# Patient Record
Sex: Female | Born: 1965
Health system: Southern US, Community
[De-identification: ages and names within clinical notes are randomized; demographics above are authoritative.]

## PROBLEM LIST (undated history)

## (undated) DIAGNOSIS — G4730 Sleep apnea, unspecified: Secondary | ICD-10-CM

## (undated) DIAGNOSIS — E039 Hypothyroidism, unspecified: Secondary | ICD-10-CM

## (undated) DIAGNOSIS — I10 Essential (primary) hypertension: Secondary | ICD-10-CM

## (undated) DIAGNOSIS — F419 Anxiety disorder, unspecified: Secondary | ICD-10-CM

## (undated) DIAGNOSIS — K5090 Crohn's disease, unspecified, without complications: Secondary | ICD-10-CM

## (undated) DIAGNOSIS — F32A Depression, unspecified: Secondary | ICD-10-CM

## (undated) DIAGNOSIS — M1990 Unspecified osteoarthritis, unspecified site: Secondary | ICD-10-CM

## (undated) HISTORY — DX: Hypothyroidism, unspecified: E03.9

## (undated) HISTORY — PX: BREAST SURGERY: SHX581

## (undated) HISTORY — PX: THYROIDECTOMY, PARTIAL: SHX18

## (undated) HISTORY — PX: CARPAL TUNNEL RELEASE: SHX101

## (undated) HISTORY — PX: OTHER SURGICAL HISTORY: SHX169

## (undated) HISTORY — PX: WISDOM TOOTH EXTRACTION: SHX21

## (undated) HISTORY — PX: KNEE ARTHROSCOPY: SHX127

## (undated) SURGERY — Surgical Case
Anesthesia: *Unknown

---

## 2001-02-17 HISTORY — PX: OOPHORECTOMY: SHX86

## 2006-02-17 HISTORY — PX: BREAST LUMPECTOMY: SHX2

## 2014-02-17 HISTORY — PX: BOWEL RESECTION: SHX1257

## 2018-11-29 DIAGNOSIS — Z23 Encounter for immunization: Secondary | ICD-10-CM | POA: Diagnosis not present

## 2018-11-29 DIAGNOSIS — K5090 Crohn's disease, unspecified, without complications: Secondary | ICD-10-CM | POA: Diagnosis not present

## 2018-11-29 DIAGNOSIS — K5080 Crohn's disease of both small and large intestine without complications: Secondary | ICD-10-CM | POA: Diagnosis not present

## 2018-11-30 DIAGNOSIS — K5080 Crohn's disease of both small and large intestine without complications: Secondary | ICD-10-CM | POA: Diagnosis not present

## 2018-12-03 ENCOUNTER — Other Ambulatory Visit: Payer: Self-pay | Admitting: Gastroenterology

## 2018-12-03 DIAGNOSIS — R9389 Abnormal findings on diagnostic imaging of other specified body structures: Secondary | ICD-10-CM

## 2018-12-07 DIAGNOSIS — K5090 Crohn's disease, unspecified, without complications: Secondary | ICD-10-CM | POA: Diagnosis not present

## 2018-12-07 DIAGNOSIS — Z0000 Encounter for general adult medical examination without abnormal findings: Secondary | ICD-10-CM | POA: Diagnosis not present

## 2018-12-07 DIAGNOSIS — I10 Essential (primary) hypertension: Secondary | ICD-10-CM | POA: Diagnosis not present

## 2018-12-08 DIAGNOSIS — I10 Essential (primary) hypertension: Secondary | ICD-10-CM | POA: Diagnosis not present

## 2018-12-08 DIAGNOSIS — Z0000 Encounter for general adult medical examination without abnormal findings: Secondary | ICD-10-CM | POA: Diagnosis not present

## 2018-12-08 DIAGNOSIS — Z01419 Encounter for gynecological examination (general) (routine) without abnormal findings: Secondary | ICD-10-CM | POA: Diagnosis not present

## 2018-12-08 DIAGNOSIS — K5090 Crohn's disease, unspecified, without complications: Secondary | ICD-10-CM | POA: Diagnosis not present

## 2018-12-08 DIAGNOSIS — N6332 Unspecified lump in axillary tail of the left breast: Secondary | ICD-10-CM | POA: Diagnosis not present

## 2018-12-09 ENCOUNTER — Other Ambulatory Visit: Payer: Self-pay

## 2018-12-09 ENCOUNTER — Ambulatory Visit
Admission: RE | Admit: 2018-12-09 | Discharge: 2018-12-09 | Disposition: A | Discharge status: Home / Self Care | Payer: PPO | Source: Ambulatory Visit | Attending: Gastroenterology | Admitting: Gastroenterology

## 2018-12-09 DIAGNOSIS — R9389 Abnormal findings on diagnostic imaging of other specified body structures: Secondary | ICD-10-CM

## 2018-12-09 DIAGNOSIS — R918 Other nonspecific abnormal finding of lung field: Secondary | ICD-10-CM | POA: Diagnosis not present

## 2018-12-21 DIAGNOSIS — R2232 Localized swelling, mass and lump, left upper limb: Secondary | ICD-10-CM | POA: Diagnosis not present

## 2018-12-21 DIAGNOSIS — R2231 Localized swelling, mass and lump, right upper limb: Secondary | ICD-10-CM | POA: Diagnosis not present

## 2018-12-21 DIAGNOSIS — Z803 Family history of malignant neoplasm of breast: Secondary | ICD-10-CM | POA: Diagnosis not present

## 2019-01-06 ENCOUNTER — Other Ambulatory Visit: Payer: Self-pay | Admitting: Gastroenterology

## 2019-01-06 ENCOUNTER — Ambulatory Visit
Admission: RE | Admit: 2019-01-06 | Discharge: 2019-01-06 | Disposition: A | Discharge status: Home / Self Care | Payer: PPO | Source: Ambulatory Visit | Attending: Gastroenterology | Admitting: Gastroenterology

## 2019-01-06 DIAGNOSIS — R140 Abdominal distension (gaseous): Secondary | ICD-10-CM

## 2019-01-06 DIAGNOSIS — K5901 Slow transit constipation: Secondary | ICD-10-CM

## 2019-01-06 DIAGNOSIS — R195 Other fecal abnormalities: Secondary | ICD-10-CM | POA: Diagnosis not present

## 2019-01-06 DIAGNOSIS — K5989 Other specified functional intestinal disorders: Secondary | ICD-10-CM | POA: Diagnosis not present

## 2019-01-07 ENCOUNTER — Ambulatory Visit
Admission: RE | Admit: 2019-01-07 | Discharge: 2019-01-07 | Disposition: A | Discharge status: Home / Self Care | Payer: PPO | Source: Ambulatory Visit | Attending: Gastroenterology | Admitting: Gastroenterology

## 2019-01-07 ENCOUNTER — Other Ambulatory Visit: Payer: Self-pay | Admitting: Gastroenterology

## 2019-01-07 DIAGNOSIS — D3001 Benign neoplasm of right kidney: Secondary | ICD-10-CM | POA: Diagnosis not present

## 2019-01-07 DIAGNOSIS — R9389 Abnormal findings on diagnostic imaging of other specified body structures: Secondary | ICD-10-CM

## 2019-01-07 DIAGNOSIS — R195 Other fecal abnormalities: Secondary | ICD-10-CM | POA: Diagnosis not present

## 2019-01-07 DIAGNOSIS — Q453 Other congenital malformations of pancreas and pancreatic duct: Secondary | ICD-10-CM | POA: Diagnosis not present

## 2019-01-07 DIAGNOSIS — K8020 Calculus of gallbladder without cholecystitis without obstruction: Secondary | ICD-10-CM | POA: Diagnosis not present

## 2019-01-07 MED ORDER — IOPAMIDOL (ISOVUE-300) INJECTION 61%
100.0000 mL | Freq: Once | INTRAVENOUS | Status: AC | PRN
  Administered 2019-01-07: 100 mL via INTRAVENOUS

## 2019-01-27 DIAGNOSIS — N6332 Unspecified lump in axillary tail of the left breast: Secondary | ICD-10-CM | POA: Diagnosis not present

## 2019-01-27 DIAGNOSIS — I10 Essential (primary) hypertension: Secondary | ICD-10-CM | POA: Diagnosis not present

## 2019-01-27 DIAGNOSIS — K5090 Crohn's disease, unspecified, without complications: Secondary | ICD-10-CM | POA: Diagnosis not present

## 2019-01-27 DIAGNOSIS — Z0000 Encounter for general adult medical examination without abnormal findings: Secondary | ICD-10-CM | POA: Diagnosis not present

## 2019-02-21 DIAGNOSIS — K5901 Slow transit constipation: Secondary | ICD-10-CM | POA: Diagnosis not present

## 2019-02-21 DIAGNOSIS — N6332 Unspecified lump in axillary tail of the left breast: Secondary | ICD-10-CM | POA: Diagnosis not present

## 2019-02-21 DIAGNOSIS — K5090 Crohn's disease, unspecified, without complications: Secondary | ICD-10-CM | POA: Diagnosis not present

## 2019-02-21 DIAGNOSIS — K2100 Gastro-esophageal reflux disease with esophagitis, without bleeding: Secondary | ICD-10-CM | POA: Diagnosis not present

## 2019-02-21 DIAGNOSIS — K5080 Crohn's disease of both small and large intestine without complications: Secondary | ICD-10-CM | POA: Diagnosis not present

## 2019-02-21 DIAGNOSIS — Z0000 Encounter for general adult medical examination without abnormal findings: Secondary | ICD-10-CM | POA: Diagnosis not present

## 2019-02-21 DIAGNOSIS — I10 Essential (primary) hypertension: Secondary | ICD-10-CM | POA: Diagnosis not present

## 2019-04-12 DIAGNOSIS — Z20828 Contact with and (suspected) exposure to other viral communicable diseases: Secondary | ICD-10-CM | POA: Diagnosis not present

## 2019-08-04 DIAGNOSIS — H16223 Keratoconjunctivitis sicca, not specified as Sjogren's, bilateral: Secondary | ICD-10-CM | POA: Diagnosis not present

## 2019-08-08 DIAGNOSIS — A184 Tuberculosis of skin and subcutaneous tissue: Secondary | ICD-10-CM | POA: Diagnosis not present

## 2019-08-08 DIAGNOSIS — Z111 Encounter for screening for respiratory tuberculosis: Secondary | ICD-10-CM | POA: Diagnosis not present

## 2019-09-13 DIAGNOSIS — I10 Essential (primary) hypertension: Secondary | ICD-10-CM | POA: Diagnosis not present

## 2019-09-13 DIAGNOSIS — E785 Hyperlipidemia, unspecified: Secondary | ICD-10-CM | POA: Diagnosis not present

## 2019-09-13 DIAGNOSIS — K5090 Crohn's disease, unspecified, without complications: Secondary | ICD-10-CM | POA: Diagnosis not present

## 2019-09-13 DIAGNOSIS — Z1152 Encounter for screening for COVID-19: Secondary | ICD-10-CM | POA: Diagnosis not present

## 2019-09-13 DIAGNOSIS — J322 Chronic ethmoidal sinusitis: Secondary | ICD-10-CM | POA: Diagnosis not present

## 2019-09-13 DIAGNOSIS — N62 Hypertrophy of breast: Secondary | ICD-10-CM | POA: Diagnosis not present

## 2019-09-13 DIAGNOSIS — E039 Hypothyroidism, unspecified: Secondary | ICD-10-CM | POA: Diagnosis not present

## 2019-09-15 ENCOUNTER — Other Ambulatory Visit: Payer: Self-pay | Admitting: Physician Assistant

## 2019-09-15 ENCOUNTER — Other Ambulatory Visit: Payer: Self-pay | Admitting: *Deleted

## 2019-09-15 DIAGNOSIS — K5090 Crohn's disease, unspecified, without complications: Secondary | ICD-10-CM | POA: Diagnosis not present

## 2019-09-15 DIAGNOSIS — R110 Nausea: Secondary | ICD-10-CM | POA: Diagnosis not present

## 2019-09-15 DIAGNOSIS — K5080 Crohn's disease of both small and large intestine without complications: Secondary | ICD-10-CM | POA: Diagnosis not present

## 2019-09-15 DIAGNOSIS — K50919 Crohn's disease, unspecified, with unspecified complications: Secondary | ICD-10-CM

## 2019-09-15 DIAGNOSIS — K5900 Constipation, unspecified: Secondary | ICD-10-CM | POA: Diagnosis not present

## 2019-09-15 DIAGNOSIS — R1310 Dysphagia, unspecified: Secondary | ICD-10-CM | POA: Diagnosis not present

## 2019-09-29 ENCOUNTER — Ambulatory Visit
Admission: RE | Admit: 2019-09-29 | Discharge: 2019-09-29 | Disposition: A | Discharge status: Home / Self Care | Payer: PPO | Source: Ambulatory Visit | Attending: Physician Assistant | Admitting: Physician Assistant

## 2019-09-29 ENCOUNTER — Other Ambulatory Visit: Payer: Self-pay

## 2019-09-29 DIAGNOSIS — D1771 Benign lipomatous neoplasm of kidney: Secondary | ICD-10-CM | POA: Diagnosis not present

## 2019-09-29 DIAGNOSIS — I700 Atherosclerosis of aorta: Secondary | ICD-10-CM | POA: Diagnosis not present

## 2019-09-29 DIAGNOSIS — K8689 Other specified diseases of pancreas: Secondary | ICD-10-CM | POA: Diagnosis not present

## 2019-09-29 DIAGNOSIS — R110 Nausea: Secondary | ICD-10-CM

## 2019-09-29 DIAGNOSIS — K8020 Calculus of gallbladder without cholecystitis without obstruction: Secondary | ICD-10-CM | POA: Diagnosis not present

## 2019-09-29 DIAGNOSIS — K50919 Crohn's disease, unspecified, with unspecified complications: Secondary | ICD-10-CM

## 2019-09-29 MED ORDER — IOPAMIDOL (ISOVUE-300) INJECTION 61%
100.0000 mL | Freq: Once | INTRAVENOUS | Status: AC | PRN
  Administered 2019-09-29: 100 mL via INTRAVENOUS

## 2019-10-06 DIAGNOSIS — Z1152 Encounter for screening for COVID-19: Secondary | ICD-10-CM | POA: Diagnosis not present

## 2019-10-06 DIAGNOSIS — K5090 Crohn's disease, unspecified, without complications: Secondary | ICD-10-CM | POA: Diagnosis not present

## 2019-10-06 DIAGNOSIS — J322 Chronic ethmoidal sinusitis: Secondary | ICD-10-CM | POA: Diagnosis not present

## 2019-10-06 DIAGNOSIS — N62 Hypertrophy of breast: Secondary | ICD-10-CM | POA: Diagnosis not present

## 2019-10-06 DIAGNOSIS — I10 Essential (primary) hypertension: Secondary | ICD-10-CM | POA: Diagnosis not present

## 2019-10-06 DIAGNOSIS — J029 Acute pharyngitis, unspecified: Secondary | ICD-10-CM | POA: Diagnosis not present

## 2019-10-25 DIAGNOSIS — G8929 Other chronic pain: Secondary | ICD-10-CM | POA: Diagnosis not present

## 2019-10-25 DIAGNOSIS — M549 Dorsalgia, unspecified: Secondary | ICD-10-CM | POA: Diagnosis not present

## 2019-10-25 DIAGNOSIS — L304 Erythema intertrigo: Secondary | ICD-10-CM | POA: Diagnosis not present

## 2019-10-25 DIAGNOSIS — M542 Cervicalgia: Secondary | ICD-10-CM | POA: Diagnosis not present

## 2019-10-25 DIAGNOSIS — N62 Hypertrophy of breast: Secondary | ICD-10-CM | POA: Diagnosis not present

## 2019-10-27 ENCOUNTER — Other Ambulatory Visit: Payer: Self-pay | Admitting: Gastroenterology

## 2019-10-27 ENCOUNTER — Ambulatory Visit
Admission: RE | Admit: 2019-10-27 | Discharge: 2019-10-27 | Disposition: A | Discharge status: Home / Self Care | Payer: PPO | Source: Ambulatory Visit | Attending: Gastroenterology | Admitting: Gastroenterology

## 2019-10-27 DIAGNOSIS — K5900 Constipation, unspecified: Secondary | ICD-10-CM

## 2019-10-27 DIAGNOSIS — R1012 Left upper quadrant pain: Secondary | ICD-10-CM | POA: Diagnosis not present

## 2019-10-27 DIAGNOSIS — R112 Nausea with vomiting, unspecified: Secondary | ICD-10-CM | POA: Diagnosis not present

## 2019-11-03 ENCOUNTER — Ambulatory Visit: Payer: PPO | Admitting: Neurology

## 2019-11-03 ENCOUNTER — Encounter: Payer: Self-pay | Admitting: Neurology

## 2019-11-03 VITALS — BP 132/80 | HR 66 | Ht 65.0 in | Wt 166.0 lb

## 2019-11-03 DIAGNOSIS — R635 Abnormal weight gain: Secondary | ICD-10-CM

## 2019-11-03 DIAGNOSIS — G4733 Obstructive sleep apnea (adult) (pediatric): Secondary | ICD-10-CM

## 2019-11-03 DIAGNOSIS — E663 Overweight: Secondary | ICD-10-CM

## 2019-11-03 DIAGNOSIS — R351 Nocturia: Secondary | ICD-10-CM

## 2019-11-03 NOTE — Progress Notes (Signed)
Subjective:    Patient ID: Rhonda Wilkerson is a 54 y.o. female.  HPI     Rhonda Age, MD, PhD [facility] [address] [place], [place] [address]  Dear Dr. Darron Doom,  I saw your patient, Rhonda Wilkerson, upon your kind request, In my sleep clinic today for initial consultation of her sleep disorder, in particular, concern for underlying obstructive sleep apnea.  The patient is accompanied by her wife, Rhonda Wilkerson, today.  As you know, Rhonda Wilkerson is a 54 year old right-handed woman with an underlying medical history of hypertension, hyperlipidemia, hypothyroidism, Crohn's disease, scoliosis, tendinitis, allergic rhinitis, and overweight state, who was diagnosed with obstructive sleep apnea and placed on CPAP therapy.  I reviewed your office note from 10/06/2019.  She had sleep study testing in [place] in 2019 and I was able to review her sleep study from 04/16/2017. Overall AHI was 5.26/h O2 nadir was 81%.  She has been on CPAP therapy.  Pressure at 6 cm, a compliance report from 05/23/2017 through 06/21/2017 showed 100% compliance with an average usage of 7 hours and 41 minutes.  Residual AHI 3.1/h, no significant leak.  I was also able to review her most recent compliance data for the past 30 days during which time she was 100% compliant, average usage of 7 hours and 53 minutes, residual AHI 2/h, no significant leak, pressure at 6 cm.  She reports that she did not have any telltale improvement in her sleep quality after starting her CPAP but her wife reports that she had significant improvement in her sleep consolidation and no longer snores.  The patient has a history of significant nocturia, an average of 3 times per night which is about the same.  She denies any recurrent morning headaches.  Unfortunately, her CPAP machine has been recalled, it is a [employer]ironics dream station.  Her DME company is in [place] and the patient has not registered her machine on  the website yet.  She reports that sometimes even the 6 cm of pressure seems too high.  She is willing to continue to use the machine, she has not had any high heat exposure to the machine or used a ozone based cleaner.  She is interested in being reevaluated for her sleep apnea.  She has slowly gained some weight in the past few years.  She has a family history of sleep apnea, both her brother and her mother have CPAP machines.  She moved with her wife from [place] to [place] little over a year ago.  They have a dog in the household, the patient is on disability.  She has smoked in the past, quit smoking in 2011 and drinks caffeine in the form of coffee, typically 2 cups in the mornings, alcohol socially.  She goes to bed around 1030 or 11 PM and rise time is around 8. Her Past Medical History Is Significant For: Past Medical History:  Diagnosis Date   Hypothyroid     Her Past Surgical History Is Significant For: Past Surgical History:  Procedure Laterality Date   BOWEL RESECTION     BREAST SURGERY     CARPAL TUNNEL RELEASE     [place]      Her Family History Is Significant For: History reviewed. No pertinent family history.  Her Social History Is Significant For: Social History   Socioeconomic History   Marital status: Single    Spouse name: Not on file   Number of children: Not on file  Years of education: Not on file   Highest education level: Not on file  Occupational History   Not on file  Tobacco Use   Smoking status: Never Smoker   Smokeless tobacco: Never Used  Substance and Sexual Activity   Alcohol use: Yes    Comment: Socially    Drug use: Never   Sexual activity: Not on file  Other Topics Concern   Not on file  Social History Narrative   Not on file   Social Determinants of Health   Financial Resource Strain:    Difficulty of Paying Living Expenses: Not on file  Food Insecurity:    Worried About [place] in the Last  Year: Not on file   Ran Out of Food in the Last Year: Not on file  Transportation Needs:    Lack of Transportation (Medical): Not on file   Lack of Transportation (Non-Medical): Not on file  Physical Activity:    Days of Exercise per Week: Not on file   Minutes of Exercise per Session: Not on file  Stress:    Feeling of Stress : Not on file  Social Connections:    Frequency of Communication with Friends and Family: Not on file   Frequency of Social Gatherings with Friends and Family: Not on file   Attends Religious Services: Not on file   Active Member of Clubs or Organizations: Not on file   Attends Archivist Meetings: Not on file   Marital Status: Not on file    Her Allergies Are:  No Known Allergies:   Her Current Medications Are:  Outpatient Encounter Medications as of 11/03/2019  Medication Sig   atorvastatin (LIPITOR) 10 MG tablet Take 10 mg by mouth daily.   Cholecalciferol (VITAMIN D3 PO) Take by mouth.   Cyanocobalamin (VITAMIN B-12 PO) Take by mouth.   FOLIC ACID PO Take by mouth.   levothyroxine (SYNTHROID) 25 MCG tablet Take 25 mcg by mouth daily before breakfast.   linaCLOtide (LINZESS PO) Take by mouth.   losartan-hydrochlorothiazide (HYZAAR) 100-25 MG tablet Take 1 tablet by mouth daily.   Multiple Vitamin (MULTIVITAMIN WITH MINERALS) TABS tablet Take 1 tablet by mouth daily.   POTASSIUM PO Take by mouth.   ustekinumab (STELARA) 90 MG/ML SOSY injection    valACYclovir (VALTREX) 1000 MG tablet Take 1,000 mg by mouth.   No facility-administered encounter medications on file as of 11/03/2019.  :  Review of Systems:  Out of a complete 14 point review of systems, all are reviewed and negative with the exception of these symptoms as listed below:  Review of Systems  Neurological:       Here for sleep consult. Prior sleep study in 2019 pt was started on machine. Pt is on a phillips machine that has been recalled. Pt wanted to  discuss the possibility of getting a new machine since she recently switched to medicare.   Epworth Sleepiness Scale 0= would never doze 1= slight chance of dozing 2= moderate chance of dozing 3= high chance of dozing  Sitting and reading:0 Watching TV:0 Sitting inactive in a public place (ex. Theater or meeting):0 As a passenger in a car for an hour without a break:0 Lying down to rest in the afternoon:1 Sitting and talking to someone:0 Sitting quietly after lunch (no alcohol):0 In a car, while stopped in traffic:0 Total:1 Pt is a compliant cpap user.    Objective:  Neurological Exam  Physical Exam Physical Examination:   Vitals:  11/03/19 1540  BP: 132/80  Pulse: 66  SpO2: 99%    General Examination: The patient is a very pleasant 54 y.o. female in no acute distress. She appears well-developed and well-nourished and well groomed.   HEENT: Normocephalic, atraumatic, pupils are equal, round and reactive to light, extraocular tracking is good without limitation to gaze excursion or nystagmus noted. Hearing is grossly intact. Face is symmetric with normal facial animation. Speech is clear with no dysarthria noted. There is no hypophonia. There is no lip, neck/head, jaw or voice tremor. Neck is supple with full range of passive and active motion. There are no carotid bruits on auscultation. Oropharynx exam reveals: no significant mouth dryness, good dental hygiene and mild airway crowding, due to small airway entry, slightly wider tongue, tonsils in place, about 1+ bilaterally.  Mallampati is class II.  Tongue protrudes centrally and palate elevates symmetrically.  Neck circumference is 14 inches.  Chest: Clear to auscultation without wheezing, rhonchi or crackles noted.  Heart: S1+S2+0, regular and normal without murmurs, rubs or gallops noted.   Abdomen: Soft, non-tender and non-distended with normal bowel sounds appreciated on auscultation.  Extremities: There is no  pitting edema in the distal lower extremities bilaterally.   Skin: Warm and dry without trophic changes noted.   Musculoskeletal: exam reveals no obvious joint deformities, tenderness or joint swelling or erythema.   Neurologically:  Mental status: The patient is awake, alert and oriented in all 4 spheres. Her immediate and remote memory, attention, language skills and fund of knowledge are appropriate. There is no evidence of aphasia, agnosia, apraxia or anomia. Speech is clear with normal prosody and enunciation. Thought process is linear. Mood is normal and affect is normal.  Cranial nerves II - XII are as described above under HEENT exam.  Motor exam: Normal bulk, strength and tone is noted. There is no tremor, Romberg is negative. Fine motor skills and coordination: grossly intact.  Cerebellar testing: No dysmetria or intention tremor. There is no truncal or gait ataxia.  Sensory exam: intact to light touch in the upper and lower extremities.  Gait, station and balance: She stands easily. No veering to one side is noted. No leaning to one side is noted. Posture is Wilkerson-appropriate and stance is narrow based. Gait shows normal stride length and normal pace. No problems turning are noted. Tandem walk is unremarkable.                Assessment and Plan:   In summary, Tiffinie Caillier is a very pleasant 54 y.o.-year old female with an underlying medical history of hypertension, hyperlipidemia, hypothyroidism, Crohn's disease, scoliosis, tendinitis, allergic rhinitis, and overweight state, who was diagnosed with obstructive sleep apnea when she was in [place].  Testing was in February 2019.  She has been on CPAP of 6 cm with full compliance.  She has had some reduction in her sleep disruption and restlessness at night per wife's report as well as elimination of snoring.  She does have some difficulty tolerating the pressure even though she is on the lower pressure setting.  She has had some weight  gain in the past couple of years.  She has a [employer] which has been recalled.  She is encouraged to register read on the website and since she has had some change in her weight I would like to proceed with a reevaluation with a sleep study.  To that end, I will order a sleep study for a laboratory attended evaluation of her  sleep disordered breathing.  She is in the meantime advised that she can continue to use her machine so long as she has not had any high heat exposure to her machine or used a so clean or ozone based cleaner which she has not.  She is advised that we should be able to call her soon to schedule her sleep study.  Based on her updated sleep study I can write for a new machine for her. She is agreeable to continuing treatment for sleep apnea. I answered all her questions today and the patient and Alisa were in agreement.  Thank you very much for allowing me to participate in the care of this nice patient. If I can be of any further assistance to you please do not hesitate to call me at [phone].  Sincerely,   Rhonda Age, MD, PhD

## 2019-11-03 NOTE — Patient Instructions (Addendum)
Thank you for choosing [facility] for your sleep related care! It was nice to meet you today! I appreciate that you entrust me with your sleep related healthcare concerns. I hope, I was able to address at least some of your concerns today, and that I can help you feel reassured and also get better.    Here is what we discussed today and what we came up with as our plan for you:    Based on your symptoms and your exam I believe you may still be at risk for obstructive sleep apnea and would benefit from re-evaluation as you have gained some weight and need a new machine.   Therefore, I think we should proceed with a sleep study to determine how severe your sleep apnea is.  Please try to go online to register your current DreamStation machine as it has been recalled.  In the meantime, as long as you have not been using a so clean or ozone based treatment machine and have not had any high heat exposure to your machine, you should be able to continue to use your machine.  Please remember, the risks and ramifications of moderate to severe obstructive sleep apnea or OSA are: Cardiovascular disease, including congestive heart failure, stroke, difficult to control hypertension, arrhythmias, and even type 2 diabetes has been linked to untreated OSA. Sleep apnea causes disruption of sleep and sleep deprivation in most cases, which, in turn, can cause recurrent headaches, problems with memory, mood, concentration, focus, and vigilance. Most people with untreated sleep apnea report excessive daytime sleepiness, which can affect their ability to drive. Please do not drive if you feel sleepy.   I plan to see you back after your sleep study and we will also call you with your test results once I have had a chance to look at your sleep study.   Our sleep lab administrative assistant will call you to schedule your sleep study. If you don't hear back from her by about 2 weeks from now, please feel  free to call her at [phone]. You can leave a message with your phone number and concerns, if you get the voicemail box. She will call back as soon as possible.

## 2019-11-07 ENCOUNTER — Telehealth: Payer: Self-pay

## 2019-11-07 DIAGNOSIS — G4733 Obstructive sleep apnea (adult) (pediatric): Secondary | ICD-10-CM

## 2019-11-07 DIAGNOSIS — E663 Overweight: Secondary | ICD-10-CM

## 2019-11-07 DIAGNOSIS — R635 Abnormal weight gain: Secondary | ICD-10-CM

## 2019-11-07 NOTE — Telephone Encounter (Signed)
HST order placed in epic.

## 2019-11-07 NOTE — Telephone Encounter (Signed)
[employer] will not cover an in lab sleep study. Does not meet medical criteria. Will need HST order.

## 2019-11-09 ENCOUNTER — Telehealth: Payer: Self-pay

## 2019-11-09 NOTE — Telephone Encounter (Signed)
LVM for pt to call me back to schedule sleep study  

## 2019-11-22 DIAGNOSIS — Z1152 Encounter for screening for COVID-19: Secondary | ICD-10-CM | POA: Diagnosis not present

## 2019-11-22 DIAGNOSIS — J322 Chronic ethmoidal sinusitis: Secondary | ICD-10-CM | POA: Diagnosis not present

## 2019-11-22 DIAGNOSIS — I10 Essential (primary) hypertension: Secondary | ICD-10-CM | POA: Diagnosis not present

## 2019-11-22 DIAGNOSIS — K5090 Crohn's disease, unspecified, without complications: Secondary | ICD-10-CM | POA: Diagnosis not present

## 2019-11-23 DIAGNOSIS — Z23 Encounter for immunization: Secondary | ICD-10-CM | POA: Diagnosis not present

## 2019-11-23 DIAGNOSIS — I10 Essential (primary) hypertension: Secondary | ICD-10-CM | POA: Diagnosis not present

## 2019-11-23 DIAGNOSIS — E039 Hypothyroidism, unspecified: Secondary | ICD-10-CM | POA: Diagnosis not present

## 2019-11-23 DIAGNOSIS — Z1152 Encounter for screening for COVID-19: Secondary | ICD-10-CM | POA: Diagnosis not present

## 2019-11-23 DIAGNOSIS — F411 Generalized anxiety disorder: Secondary | ICD-10-CM | POA: Diagnosis not present

## 2019-11-23 DIAGNOSIS — K5090 Crohn's disease, unspecified, without complications: Secondary | ICD-10-CM | POA: Diagnosis not present

## 2019-11-23 DIAGNOSIS — F39 Unspecified mood [affective] disorder: Secondary | ICD-10-CM | POA: Diagnosis not present

## 2019-11-23 DIAGNOSIS — E785 Hyperlipidemia, unspecified: Secondary | ICD-10-CM | POA: Diagnosis not present

## 2019-11-23 DIAGNOSIS — Z20822 Contact with and (suspected) exposure to covid-19: Secondary | ICD-10-CM | POA: Diagnosis not present

## 2019-11-30 ENCOUNTER — Ambulatory Visit (INDEPENDENT_AMBULATORY_CARE_PROVIDER_SITE_OTHER): Payer: PPO | Admitting: Neurology

## 2019-11-30 DIAGNOSIS — G4733 Obstructive sleep apnea (adult) (pediatric): Secondary | ICD-10-CM

## 2019-11-30 DIAGNOSIS — E663 Overweight: Secondary | ICD-10-CM

## 2019-11-30 DIAGNOSIS — R635 Abnormal weight gain: Secondary | ICD-10-CM

## 2019-12-02 DIAGNOSIS — K5900 Constipation, unspecified: Secondary | ICD-10-CM | POA: Diagnosis not present

## 2019-12-02 DIAGNOSIS — K5090 Crohn's disease, unspecified, without complications: Secondary | ICD-10-CM | POA: Diagnosis not present

## 2019-12-06 DIAGNOSIS — M25561 Pain in right knee: Secondary | ICD-10-CM | POA: Diagnosis not present

## 2019-12-07 ENCOUNTER — Telehealth: Payer: Self-pay

## 2019-12-07 NOTE — Addendum Note (Signed)
Addended by: Star Age on: 12/07/2019 02:03 PM   Modules accepted: Orders

## 2019-12-07 NOTE — Telephone Encounter (Signed)
-----   Message from Star Age, MD sent at 12/07/2019  2:03 PM EDT ----- Patient referred by Dr. Darron Doom for re-eval of her OSA, she has been on CPAP of 6 cm, study done in [place]. I saw her on 11/03/19, HST on 11/30/19.  Please call and notify the patient that the recent home sleep test confirmed her diagnosis of mild obstructive sleep apnea.  Her CPAP machine has been under recall.  I will prescribe an AutoPap machine.  We should be able to get her established with a local DME company.  She will need a follow-up appointment in 3 months after starting AutoPap therapy.   Please arrange appt with me or one of our NPs. Thanks,   Star Age, MD, PhD [facility])

## 2019-12-07 NOTE — Telephone Encounter (Signed)
I called pt. No answer, left a message asking pt to call me back.   

## 2019-12-07 NOTE — Telephone Encounter (Signed)
I called pt. I advised pt that Dr. Rexene Alberts reviewed their sleep study results and found that pt mild osa. Dr. Rexene Alberts recommends that pt start autopap for treatment. I reviewed PAP compliance expectations with the pt. Pt is agreeable to starting an auto-PAP. I advised pt that an order will be sent to a DME, [facility], and [facility] will call the pt within about one week after they file with the pt's insurance. [facility] will show the pt how to use the machine, fit for masks, and troubleshoot the auto-PAP if needed. A follow up appt was made for insurance purposes with Dr. 02/15/2020 at 930 am. Pt verbalized understanding to arrive 15 minutes early and bring their auto-PAP. A letter with all of this information in it will be mailed to the pt as a reminder. I verified with the pt that the address we have on file is correct. Pt verbalized understanding of results. Pt had no questions at this time but was encouraged to call back if questions arise. I have sent the order to [facility] and have received confirmation that they have received the order.

## 2019-12-07 NOTE — Procedures (Signed)
Sleep Study Report   Patient Information     First Name: Rhonda Last Name: Wilkerson ID: [id number]  Birth Date: 01/03/2066 Age: 54 Gender: Female  Referring Provider: Hayden Rasmussen, MD BMI: 27.5 (W=165 lb, H=5' 5'')  Neck Circ.:  14 '' Epworth:  1/24   Sleep Study Information    Study Date: 11/30/19 S/H/A Version: [id number] / 4.2.1023 / 82  History:    54 year old woman with a history of hypertension, hyperlipidemia, hypothyroidism, Crohn's disease, scoliosis, tendinitis, allergic rhinitis, and overweight state, who was diagnosed with obstructive sleep apnea and placed on CPAP therapy. She has had some weight fluctuation and presents for re-evaluation.  Summary & Diagnosis:     OSA (obstructive sleep apnea) Recommendations:     This home sleep test demonstrates overall mild obstructive sleep apnea with a total AHI of 6.5/hour and O2 nadir of 83%. Snoring appeared to be intermittent, and in the mild to moderate range. Given the patient's medical history and sleep related complaints, treatment with positive airway pressure is recommended. This can be achieved in the form of autoPAP trial/titration at home. A full night CPAP titration study will help with proper treatment settings and mask fitting if needed, down the road. Alternative treatments may include weight loss along with avoidance of the supine sleep position, or an oral appliance in appropriate candidates.   Please note that untreated obstructive sleep apnea may carry additional perioperative morbidity. Patients with significant obstructive sleep apnea should receive perioperative PAP therapy and the surgeons and particularly the anesthesiologist should be informed of the diagnosis and the severity of the sleep disordered breathing. The patient should be cautioned not to drive, work at heights, or operate dangerous or heavy equipment when tired or sleepy. Review and reiteration of good sleep hygiene measures should be pursued with any  patient. Other causes of the patient's symptoms, including circadian rhythm disturbances, an underlying mood disorder, medication effect and/or an underlying medical problem cannot be ruled out based on this test. Clinical correlation is recommended.   The patient and her referring provider will be notified of the test results. The patient will be seen in follow up in sleep clinic at [facility].  I certify that I have reviewed the raw data recording prior to the issuance of this report in accordance with the standards of the American Academy of Sleep Medicine (AASM).  Star Age, MD, PhD [facility]) Diplomat, ABPN (Neurology and Sleep)         Sleep Summary  Oxygen Saturation Statistics   Start Study Time: End Study Time: Total Recording Time:          10:10:53 PM 7:48:55 AM   9 h, 38 min  Total Sleep Time % REM of Sleep Time:  7 h, 19 min  18.9    Mean: 94 Minimum: 83 Maximum: 98  Mean of Desaturations Nadirs (%):   92  Oxygen Desaturation. %:   4-9 10-20 >20 Total  Events Number Total    13  1 92.9 7.1  0 0.0  14 100.0  Oxygen Saturation: <90 <=88 <85 <80 <70  Duration (minutes): Sleep % 0.1 0.0  0.1 0.0  0.0 0.0 0.0 0.0 0.0 0.0     Respiratory Indices      Total Events REM NREM All Night  pRDI: pAHI 3%: ODI 4%: pAHIc 3%: % CSR: pAHI 4%:  69  47  14  9 0.0 18 8.7 7.2 2.2 0.7 9.7 6.3 1.9 1.4 9.5  6.5 1.9 1.3 2.5       Pulse Rate Statistics during Sleep (BPM)      Mean: 71 Minimum: 58 Maximum: 97    Indices are calculated using technically valid sleep time of 7 h 15 min.         5              15                    30           pAHI=6.5                                                         Mild              Moderate                    Severe              Body Position Statistics  Position Supine Prone Right Left Non-Supine  Sleep (min) 50.0 211.0 98.5 79.5 389.0  Sleep % 11.4 48.1 22.4 18.1 88.6  pRDI  14.0 12.2 3.7 6.8 9.0  pAHI 3% 14.0 8.3 0.6 4.5 5.6  ODI 4% 7.7 2.0 0.0 0.8 1.2               Left   Prone  Right  Supine    Snoring Statistics Snoring Level (dB) >40 >50 >60 >70 >80 >Threshold (45)  Sleep (min) 210.8 36.0 5.1 0.0 0.0 55.7  Sleep % 48.0 8.2 1.2 0.0 0.0 12.7    Mean: 42 dB Sleep Stages Chart                         Wake  Sleep      Wake  24.05  %    Sleep  75.95  %   Total:  100.00  %                                                       REM  Light  Deep      REM  18.91  %    Light  71.52  %    Deep  9.57  %   Total:  100.00  %                                 Sleep/Wake States  Sleep Stages  Sleep Latency (min):  REM Latency (min):  Number of Wakes:   19   168   17

## 2019-12-07 NOTE — Telephone Encounter (Signed)
Pt returned phone call.  

## 2019-12-07 NOTE — Progress Notes (Signed)
Patient referred by Dr. Darron Doom for re-eval of her OSA, she has been on CPAP of 6 cm, study done in [place]. I saw her on 11/03/19, HST on 11/30/19.  Please call and notify the patient that the recent home sleep test confirmed her diagnosis of mild obstructive sleep apnea.  Her CPAP machine has been under recall.  I will prescribe an AutoPap machine.  We should be able to get her established with a local DME company.  She will need a follow-up appointment in 3 months after starting AutoPap therapy.   Please arrange appt with me or one of our NPs. Thanks,   Star Age, MD, PhD [facility])

## 2019-12-08 DIAGNOSIS — F39 Unspecified mood [affective] disorder: Secondary | ICD-10-CM | POA: Diagnosis not present

## 2019-12-08 DIAGNOSIS — F411 Generalized anxiety disorder: Secondary | ICD-10-CM | POA: Diagnosis not present

## 2019-12-08 DIAGNOSIS — K5090 Crohn's disease, unspecified, without complications: Secondary | ICD-10-CM | POA: Diagnosis not present

## 2019-12-08 DIAGNOSIS — Z1152 Encounter for screening for COVID-19: Secondary | ICD-10-CM | POA: Diagnosis not present

## 2019-12-14 DIAGNOSIS — M542 Cervicalgia: Secondary | ICD-10-CM | POA: Diagnosis not present

## 2019-12-26 DIAGNOSIS — Z1231 Encounter for screening mammogram for malignant neoplasm of breast: Secondary | ICD-10-CM | POA: Diagnosis not present

## 2019-12-29 DIAGNOSIS — L304 Erythema intertrigo: Secondary | ICD-10-CM | POA: Diagnosis not present

## 2019-12-29 DIAGNOSIS — M542 Cervicalgia: Secondary | ICD-10-CM | POA: Diagnosis not present

## 2019-12-29 DIAGNOSIS — N62 Hypertrophy of breast: Secondary | ICD-10-CM | POA: Diagnosis not present

## 2019-12-29 DIAGNOSIS — G8929 Other chronic pain: Secondary | ICD-10-CM | POA: Diagnosis not present

## 2019-12-29 DIAGNOSIS — M549 Dorsalgia, unspecified: Secondary | ICD-10-CM | POA: Diagnosis not present

## 2019-12-29 NOTE — H&P (Signed)
Subjective:     Patient ID: Rhonda Wilkerson is a 54 y.o. female.  HPI  Here for follow up discussion prior to planned breast reduction. Current 38/40 C/D. Reports several year history neck back and shoulder pain. Reports bra size increased over last year. Reports daily yeast type rash beneath breasts, tries to control this with hygiene measures and medicated powder. For pain has tried hot/cold packs, specialty fitted bras, OTC pain medication without relief.  Wt stable.  MMG 12/2019 [facility]. Notes has had prior left breast biopsy 2011 benign. Per patient multiple right breast cysts removed. Mother and PA with breast ca.  PMH significant for Crohns in remission, OSA- since last visit auto PAP recommended, HTN, HLD, hypothyroid. Crohns and Stelara managed by Dr. [name].  Not working. Accompanied by partner who herself had breast reduction in past. Moved from [place] to [place] appr year ago.   Review of Systems    Objective:   Physical Exam Cardiovascular:     Rate and Rhythm: Normal rate and regular rhythm.     Heart sounds: Normal heart sounds.  Pulmonary:     Effort: Pulmonary effort is normal.     Breath sounds: Normal breath sounds.     breasts no masses Grade 3 ptosis bilateral +shoulder grooving SN to nipple R 6 L 26 cm BW R 22 L 22 cm Nipple to IMF R 15 L 15 cm     Assessment:     Macromastia Chronic neck and back pain    Plan:     Chronic neck and back pain in setting of macromastia that has failed conservative measures. Breast reduction has reasonable expectation to resolve symptoms.   On Stelara- last dose October and next due 01/17/20. Receives once q 8 weeks. Reviewed increased risk infection with this medication. Spoke with Dr. [name]. Plan to continue injection as scheduled unless post operative healing problems encountered.  Reviewed reduction with anchor type scars, OP surgery, drains, post operative visits and limitations, recovery. Diminished sensation  nipple and breast skin, risk of nipple loss, wound healing problems, asymmetry, incidental carcinoma, changes with wt gain/loss, aging, unacceptable cosmetic appearance reviewed. Desires B cup, reviewed cannot assure her cup size.  Additional risks including but not limited to bleeding, hematoma, seroma, damage to adjacent structures, need for additional procedures, blood clots in legs or lungs reviewed.  Anticipate at least 500 g reduction from each breast. Pictures taken.  Discussed risk COVID infectionthrough this elective surgery. Patient will receive COVID testing prior to surgery. Discussed even if patient receivesa negative test result, the tests in some cases may fail to detect the virus or patient maycontract COVID after the test.COVID 19 infectionbefore/during/aftersurgery may result in lead to a higher chance of complication and death.  Drain teaching completed. Rx for Percocet given.

## 2019-12-30 ENCOUNTER — Other Ambulatory Visit: Payer: Self-pay

## 2019-12-30 ENCOUNTER — Encounter (HOSPITAL_BASED_OUTPATIENT_CLINIC_OR_DEPARTMENT_OTHER): Payer: Self-pay | Admitting: Plastic Surgery

## 2019-12-30 DIAGNOSIS — G4733 Obstructive sleep apnea (adult) (pediatric): Secondary | ICD-10-CM | POA: Diagnosis not present

## 2020-01-03 ENCOUNTER — Other Ambulatory Visit (HOSPITAL_COMMUNITY)
Admission: RE | Admit: 2020-01-03 | Discharge: 2020-01-03 | Disposition: A | Discharge status: Home / Self Care | Payer: PPO | Source: Ambulatory Visit | Attending: Plastic Surgery | Admitting: Plastic Surgery

## 2020-01-03 ENCOUNTER — Encounter (HOSPITAL_BASED_OUTPATIENT_CLINIC_OR_DEPARTMENT_OTHER)
Admission: RE | Admit: 2020-01-03 | Discharge: 2020-01-03 | Disposition: A | Discharge status: Home / Self Care | Payer: PPO | Source: Ambulatory Visit | Attending: Plastic Surgery | Admitting: Plastic Surgery

## 2020-01-03 DIAGNOSIS — Z20822 Contact with and (suspected) exposure to covid-19: Secondary | ICD-10-CM | POA: Insufficient documentation

## 2020-01-03 DIAGNOSIS — Z01812 Encounter for preprocedural laboratory examination: Secondary | ICD-10-CM | POA: Insufficient documentation

## 2020-01-03 LAB — CBC WITH DIFFERENTIAL/PLATELET
Abs Immature Granulocytes: 0.01 10*3/uL (ref 0.00–0.07)
Basophils Absolute: 0.1 10*3/uL (ref 0.0–0.1)
Basophils Relative: 1 %
Eosinophils Absolute: 0.2 10*3/uL (ref 0.0–0.5)
Eosinophils Relative: 4 %
HCT: 42.8 % (ref 36.0–46.0)
Hemoglobin: 13.7 g/dL (ref 12.0–15.0)
Immature Granulocytes: 0 %
Lymphocytes Relative: 21 %
Lymphs Abs: 1.2 10*3/uL (ref 0.7–4.0)
MCH: 29.1 pg (ref 26.0–34.0)
MCHC: 32 g/dL (ref 30.0–36.0)
MCV: 91.1 fL (ref 80.0–100.0)
Monocytes Absolute: 0.6 10*3/uL (ref 0.1–1.0)
Monocytes Relative: 10 %
Neutro Abs: 3.5 10*3/uL (ref 1.7–7.7)
Neutrophils Relative %: 64 %
Platelets: 223 10*3/uL (ref 150–400)
RBC: 4.7 MIL/uL (ref 3.87–5.11)
RDW: 13.1 % (ref 11.5–15.5)
WBC: 5.6 10*3/uL (ref 4.0–10.5)
nRBC: 0 % (ref 0.0–0.2)

## 2020-01-03 LAB — BASIC METABOLIC PANEL
Anion gap: 10 (ref 5–15)
BUN: 14 mg/dL (ref 6–20)
CO2: 25 mmol/L (ref 22–32)
Calcium: 9.3 mg/dL (ref 8.9–10.3)
Chloride: 105 mmol/L (ref 98–111)
Creatinine, Ser: 0.67 mg/dL (ref 0.44–1.00)
GFR, Estimated: >60 mL/min (ref >60)
Glucose, Bld: 99 mg/dL (ref 70–99)
Potassium: 4.2 mmol/L (ref 3.5–5.1)
Sodium: 140 mmol/L (ref 135–145)

## 2020-01-03 LAB — SARS CORONAVIRUS 2 (TAT 6-24 HRS): SARS Coronavirus 2: NEGATIVE

## 2020-01-03 NOTE — Progress Notes (Signed)

## 2020-01-06 ENCOUNTER — Encounter (HOSPITAL_BASED_OUTPATIENT_CLINIC_OR_DEPARTMENT_OTHER)
Admission: RE | Disposition: A | Discharge status: Home / Self Care | Payer: Self-pay | Source: Home / Self Care | Attending: Plastic Surgery

## 2020-01-06 ENCOUNTER — Other Ambulatory Visit: Payer: Self-pay

## 2020-01-06 ENCOUNTER — Ambulatory Visit (HOSPITAL_BASED_OUTPATIENT_CLINIC_OR_DEPARTMENT_OTHER)
Admission: RE | Admit: 2020-01-06 | Discharge: 2020-01-06 | Disposition: A | Discharge status: Home / Self Care | Payer: PPO | Attending: Plastic Surgery | Admitting: Plastic Surgery

## 2020-01-06 ENCOUNTER — Ambulatory Visit (HOSPITAL_BASED_OUTPATIENT_CLINIC_OR_DEPARTMENT_OTHER): Payer: PPO | Admitting: Anesthesiology

## 2020-01-06 ENCOUNTER — Encounter (HOSPITAL_BASED_OUTPATIENT_CLINIC_OR_DEPARTMENT_OTHER): Payer: Self-pay | Admitting: Plastic Surgery

## 2020-01-06 DIAGNOSIS — M549 Dorsalgia, unspecified: Secondary | ICD-10-CM | POA: Insufficient documentation

## 2020-01-06 DIAGNOSIS — N62 Hypertrophy of breast: Secondary | ICD-10-CM | POA: Diagnosis not present

## 2020-01-06 DIAGNOSIS — G8929 Other chronic pain: Secondary | ICD-10-CM | POA: Diagnosis not present

## 2020-01-06 DIAGNOSIS — L304 Erythema intertrigo: Secondary | ICD-10-CM | POA: Insufficient documentation

## 2020-01-06 DIAGNOSIS — Z803 Family history of malignant neoplasm of breast: Secondary | ICD-10-CM | POA: Insufficient documentation

## 2020-01-06 DIAGNOSIS — M542 Cervicalgia: Secondary | ICD-10-CM | POA: Insufficient documentation

## 2020-01-06 HISTORY — DX: Depression, unspecified: F32.A

## 2020-01-06 HISTORY — DX: Anxiety disorder, unspecified: F41.9

## 2020-01-06 HISTORY — PX: BREAST REDUCTION SURGERY: SHX8

## 2020-01-06 HISTORY — DX: Sleep apnea, unspecified: G47.30

## 2020-01-06 HISTORY — DX: Crohn's disease, unspecified, without complications: K50.90

## 2020-01-06 HISTORY — DX: Essential (primary) hypertension: I10

## 2020-01-06 SURGERY — MAMMOPLASTY, REDUCTION
Anesthesia: General | Site: Breast | Laterality: Bilateral

## 2020-01-06 MED ORDER — LIDOCAINE 2% (20 MG/ML) 5 ML SYRINGE
INTRAMUSCULAR | Status: AC
  Filled 2020-01-06: qty 5

## 2020-01-06 MED ORDER — EPHEDRINE SULFATE 50 MG/ML IJ SOLN
INTRAMUSCULAR | Status: DC | PRN
  Administered 2020-01-06: 10 mg via INTRAVENOUS

## 2020-01-06 MED ORDER — CELECOXIB 200 MG PO CAPS
ORAL_CAPSULE | ORAL | Status: AC
  Filled 2020-01-06: qty 1

## 2020-01-06 MED ORDER — SUGAMMADEX SODIUM 200 MG/2ML IV SOLN
INTRAVENOUS | Status: DC | PRN
  Administered 2020-01-06: 200 mg via INTRAVENOUS

## 2020-01-06 MED ORDER — GABAPENTIN 300 MG PO CAPS
300.0000 mg | ORAL_CAPSULE | ORAL | Status: AC
  Administered 2020-01-06: 300 mg via ORAL

## 2020-01-06 MED ORDER — BUPIVACAINE-EPINEPHRINE (PF) 0.25% -1:200000 IJ SOLN
INTRAMUSCULAR | Status: DC | PRN
  Administered 2020-01-06: 30 mL

## 2020-01-06 MED ORDER — CELECOXIB 200 MG PO CAPS
200.0000 mg | ORAL_CAPSULE | ORAL | Status: AC
  Administered 2020-01-06: 200 mg via ORAL

## 2020-01-06 MED ORDER — CEFAZOLIN SODIUM-DEXTROSE 2-4 GM/100ML-% IV SOLN
2.0000 g | INTRAVENOUS | Status: AC
  Administered 2020-01-06: 2 g via INTRAVENOUS

## 2020-01-06 MED ORDER — FENTANYL CITRATE (PF) 100 MCG/2ML IJ SOLN
INTRAMUSCULAR | Status: DC | PRN
  Administered 2020-01-06: 100 ug via INTRAVENOUS
  Administered 2020-01-06 (×2): 50 ug via INTRAVENOUS

## 2020-01-06 MED ORDER — CEFAZOLIN SODIUM-DEXTROSE 2-4 GM/100ML-% IV SOLN
INTRAVENOUS | Status: AC
  Filled 2020-01-06: qty 100

## 2020-01-06 MED ORDER — ACETAMINOPHEN 500 MG PO TABS
1000.0000 mg | ORAL_TABLET | ORAL | Status: AC
  Administered 2020-01-06: 1000 mg via ORAL

## 2020-01-06 MED ORDER — ROCURONIUM BROMIDE 100 MG/10ML IV SOLN
INTRAVENOUS | Status: DC | PRN
  Administered 2020-01-06: 80 mg via INTRAVENOUS

## 2020-01-06 MED ORDER — DEXAMETHASONE SODIUM PHOSPHATE 4 MG/ML IJ SOLN
INTRAMUSCULAR | Status: DC | PRN
  Administered 2020-01-06: 4 mg via INTRAVENOUS
  Administered 2020-01-06: 10 mg via INTRAVENOUS

## 2020-01-06 MED ORDER — GABAPENTIN 300 MG PO CAPS
ORAL_CAPSULE | ORAL | Status: AC
  Filled 2020-01-06: qty 1

## 2020-01-06 MED ORDER — FENTANYL CITRATE (PF) 100 MCG/2ML IJ SOLN
INTRAMUSCULAR | Status: AC
  Filled 2020-01-06: qty 2

## 2020-01-06 MED ORDER — PROPOFOL 500 MG/50ML IV EMUL
INTRAVENOUS | Status: AC
  Filled 2020-01-06: qty 50

## 2020-01-06 MED ORDER — DEXAMETHASONE SODIUM PHOSPHATE 10 MG/ML IJ SOLN
INTRAMUSCULAR | Status: AC
  Filled 2020-01-06: qty 1

## 2020-01-06 MED ORDER — ACETAMINOPHEN 500 MG PO TABS
ORAL_TABLET | ORAL | Status: AC
  Filled 2020-01-06: qty 2

## 2020-01-06 MED ORDER — ONDANSETRON HCL 4 MG/2ML IJ SOLN
INTRAMUSCULAR | Status: AC
  Filled 2020-01-06: qty 2

## 2020-01-06 MED ORDER — CHLORHEXIDINE GLUCONATE CLOTH 2 % EX PADS: 6.0000 | MEDICATED_PAD | Freq: Once | CUTANEOUS | Status: DC

## 2020-01-06 MED ORDER — HYDROMORPHONE HCL 1 MG/ML IJ SOLN
INTRAMUSCULAR | Status: AC
  Filled 2020-01-06: qty 0.5

## 2020-01-06 MED ORDER — PROMETHAZINE HCL 25 MG/ML IJ SOLN: 6.2500 mg | INTRAMUSCULAR | Status: DC | PRN

## 2020-01-06 MED ORDER — OXYCODONE HCL 5 MG PO TABS: 5.0000 mg | ORAL_TABLET | Freq: Once | ORAL | Status: DC | PRN

## 2020-01-06 MED ORDER — MIDAZOLAM HCL 2 MG/2ML IJ SOLN
INTRAMUSCULAR | Status: AC
  Filled 2020-01-06: qty 2

## 2020-01-06 MED ORDER — ROCURONIUM BROMIDE 10 MG/ML (PF) SYRINGE
PREFILLED_SYRINGE | INTRAVENOUS | Status: AC
  Filled 2020-01-06: qty 10

## 2020-01-06 MED ORDER — 0.9 % SODIUM CHLORIDE (POUR BTL) OPTIME
TOPICAL | Status: DC | PRN
  Administered 2020-01-06: 600 mL

## 2020-01-06 MED ORDER — MEPERIDINE HCL 25 MG/ML IJ SOLN: 6.2500 mg | INTRAMUSCULAR | Status: DC | PRN

## 2020-01-06 MED ORDER — SUCCINYLCHOLINE CHLORIDE 200 MG/10ML IV SOSY
PREFILLED_SYRINGE | INTRAVENOUS | Status: AC
  Filled 2020-01-06: qty 10

## 2020-01-06 MED ORDER — PROPOFOL 10 MG/ML IV BOLUS
INTRAVENOUS | Status: DC | PRN
  Administered 2020-01-06: 200 mg via INTRAVENOUS

## 2020-01-06 MED ORDER — OXYCODONE HCL 5 MG/5ML PO SOLN: 5.0000 mg | Freq: Once | ORAL | Status: DC | PRN

## 2020-01-06 MED ORDER — DIPHENHYDRAMINE HCL 50 MG/ML IJ SOLN
INTRAMUSCULAR | Status: AC
  Filled 2020-01-06: qty 1

## 2020-01-06 MED ORDER — EPHEDRINE 5 MG/ML INJ
INTRAVENOUS | Status: AC
  Filled 2020-01-06: qty 10

## 2020-01-06 MED ORDER — LACTATED RINGERS IV SOLN: INTRAVENOUS | Status: DC

## 2020-01-06 MED ORDER — AMISULPRIDE (ANTIEMETIC) 5 MG/2ML IV SOLN: 10.0000 mg | Freq: Once | INTRAVENOUS | Status: DC | PRN

## 2020-01-06 MED ORDER — MIDAZOLAM HCL 5 MG/5ML IJ SOLN
INTRAMUSCULAR | Status: DC | PRN
  Administered 2020-01-06: 2 mg via INTRAVENOUS

## 2020-01-06 MED ORDER — HYDROMORPHONE HCL 1 MG/ML IJ SOLN
0.2500 mg | INTRAMUSCULAR | Status: DC | PRN
  Administered 2020-01-06: 0.5 mg via INTRAVENOUS

## 2020-01-06 MED ORDER — PHENYLEPHRINE 40 MCG/ML (10ML) SYRINGE FOR IV PUSH (FOR BLOOD PRESSURE SUPPORT)
PREFILLED_SYRINGE | INTRAVENOUS | Status: AC
  Filled 2020-01-06: qty 10

## 2020-01-06 SURGICAL SUPPLY — 63 items
ADH SKN CLS APL DERMABOND .7 (GAUZE/BANDAGES/DRESSINGS) ×3
APL PRP STRL LF DISP 70% ISPRP (MISCELLANEOUS) ×2
APPLIER CLIP 9.375 MED OPEN (MISCELLANEOUS)
APR CLP MED 9.3 20 MLT OPN (MISCELLANEOUS)
BINDER BREAST 3XL (GAUZE/BANDAGES/DRESSINGS) IMPLANT
BINDER BREAST LRG (GAUZE/BANDAGES/DRESSINGS) IMPLANT
BINDER BREAST MEDIUM (GAUZE/BANDAGES/DRESSINGS) IMPLANT
BINDER BREAST XLRG (GAUZE/BANDAGES/DRESSINGS) ×3 IMPLANT
BINDER BREAST XXLRG (GAUZE/BANDAGES/DRESSINGS) IMPLANT
BLADE SURG 10 STRL SS (BLADE) ×12 IMPLANT
BNDG GAUZE ELAST 4 BULKY (GAUZE/BANDAGES/DRESSINGS) ×6 IMPLANT
CANISTER SUCT 1200ML W/VALVE (MISCELLANEOUS) ×3 IMPLANT
CHLORAPREP W/TINT 26 (MISCELLANEOUS) ×6 IMPLANT
CLIP APPLIE 9.375 MED OPEN (MISCELLANEOUS) IMPLANT
CLIP VESOCCLUDE MED 6/CT (CLIP) IMPLANT
COVER BACK TABLE 60X90IN (DRAPES) ×3 IMPLANT
COVER MAYO STAND STRL (DRAPES) ×3 IMPLANT
COVER SURGICAL LIGHT HANDLE (MISCELLANEOUS) ×6 IMPLANT
COVER WAND RF STERILE (DRAPES) IMPLANT
DERMABOND ADVANCED (GAUZE/BANDAGES/DRESSINGS) ×6
DERMABOND ADVANCED .7 DNX12 (GAUZE/BANDAGES/DRESSINGS) ×3 IMPLANT
DRAIN CHANNEL 15F RND FF W/TCR (WOUND CARE) ×6 IMPLANT
DRAPE TOP ARMCOVERS (MISCELLANEOUS) ×3 IMPLANT
DRAPE U-SHAPE 76X120 STRL (DRAPES) ×3 IMPLANT
DRAPE UTILITY XL STRL (DRAPES) ×3 IMPLANT
DRSG PAD ABDOMINAL 8X10 ST (GAUZE/BANDAGES/DRESSINGS) ×6 IMPLANT
ELECT COATED BLADE 2.86 ST (ELECTRODE) ×3 IMPLANT
ELECT REM PT RETURN 9FT ADLT (ELECTROSURGICAL) ×3
ELECTRODE REM PT RTRN 9FT ADLT (ELECTROSURGICAL) ×1 IMPLANT
EVACUATOR SILICONE 100CC (DRAIN) ×6 IMPLANT
GLOVE BIO SURGEON STRL SZ 6 (GLOVE) ×12 IMPLANT
GLOVE BIOGEL PI IND STRL 7.0 (GLOVE) ×1 IMPLANT
GLOVE BIOGEL PI IND STRL 7.5 (GLOVE) ×1 IMPLANT
GLOVE BIOGEL PI INDICATOR 7.0 (GLOVE) ×2
GLOVE BIOGEL PI INDICATOR 7.5 (GLOVE) ×2
GLOVE SURG SS PI 6.5 STRL IVOR (GLOVE) ×12 IMPLANT
GOWN STRL REUS W/ TWL LRG LVL3 (GOWN DISPOSABLE) ×1 IMPLANT
GOWN STRL REUS W/TWL LRG LVL3 (GOWN DISPOSABLE) ×6 IMPLANT
MARKER SKIN DUAL TIP RULER LAB (MISCELLANEOUS) IMPLANT
NEEDLE HYPO 25X1 1.5 SAFETY (NEEDLE) ×3 IMPLANT
NS IRRIG 1000ML POUR BTL (IV SOLUTION) ×3 IMPLANT
PACK BASIN DAY SURGERY FS (CUSTOM PROCEDURE TRAY) ×3 IMPLANT
PENCIL SMOKE EVACUATOR (MISCELLANEOUS) ×3 IMPLANT
PIN SAFETY STERILE (MISCELLANEOUS) ×3 IMPLANT
SHEET MEDIUM DRAPE 40X70 STRL (DRAPES) ×6 IMPLANT
SLEEVE SCD COMPRESS KNEE MED (MISCELLANEOUS) ×3 IMPLANT
SPONGE LAP 18X18 RF (DISPOSABLE) ×12 IMPLANT
STAPLER VISISTAT 35W (STAPLE) ×6 IMPLANT
SUT ETHILON 2 0 FS 18 (SUTURE) ×3 IMPLANT
SUT MNCRL AB 4-0 PS2 18 (SUTURE) ×12 IMPLANT
SUT PDS AB 2-0 CT2 27 (SUTURE) IMPLANT
SUT VIC AB 3-0 PS1 18 (SUTURE) ×18
SUT VIC AB 3-0 PS1 18XBRD (SUTURE) ×6 IMPLANT
SUT VICRYL 4-0 PS2 18IN ABS (SUTURE) ×6 IMPLANT
SYR BULB IRRIG 60ML STRL (SYRINGE) ×3 IMPLANT
SYR CONTROL 10ML LL (SYRINGE) ×3 IMPLANT
TAPE MEASURE VINYL STERILE (MISCELLANEOUS) IMPLANT
TOWEL GREEN STERILE FF (TOWEL DISPOSABLE) ×6 IMPLANT
TRAY FOLEY W/BAG SLVR 14FR LF (SET/KITS/TRAYS/PACK) IMPLANT
TUBE CONNECTING 20'X1/4 (TUBING) ×1
TUBE CONNECTING 20X1/4 (TUBING) ×2 IMPLANT
UNDERPAD 30X36 HEAVY ABSORB (UNDERPADS AND DIAPERS) ×6 IMPLANT
YANKAUER SUCT BULB TIP NO VENT (SUCTIONS) ×6 IMPLANT

## 2020-01-06 NOTE — Discharge Instructions (Signed)
No Tylenol or Ibuprofen until after 1:15pm today if needed  Post Anesthesia Home Care Instructions  Activity: Get plenty of rest for the remainder of the day. A responsible individual must stay with you for 24 hours following the procedure.  For the next 24 hours, DO NOT: -Drive a car -Paediatric nurse -Drink alcoholic beverages -Take any medication unless instructed by your physician -Make any legal decisions or sign important papers.  Meals: Start with liquid foods such as gelatin or soup. Progress to regular foods as tolerated. Avoid greasy, spicy, heavy foods. If nausea and/or vomiting occur, drink only clear liquids until the nausea and/or vomiting subsides. Call your physician if vomiting continues.  Special Instructions/Symptoms: Your throat may feel dry or sore from the anesthesia or the breathing tube placed in your throat during surgery. If this causes discomfort, gargle with warm salt water. The discomfort should disappear within 24 hours.  If you had a scopolamine patch placed behind your ear for the management of post- operative nausea and/or vomiting:  1. The medication in the patch is effective for 72 hours, after which it should be removed.  Wrap patch in a tissue and discard in the trash. Wash hands thoroughly with soap and water. 2. You may remove the patch earlier than 72 hours if you experience unpleasant side effects which may include dry mouth, dizziness or visual disturbances. 3. Avoid touching the patch. Wash your hands with soap and water after contact with the patch.    About my Jackson-Pratt Bulb Drain  What is a Jackson-Pratt bulb? A Jackson-Pratt is a soft, round device used to collect drainage. It is connected to a long, thin drainage catheter, which is held in place by one or two small stiches near your surgical incision site. When the bulb is squeezed, it forms a vacuum, forcing the drainage to empty into the bulb.  Emptying the Jackson-Pratt bulb- To  empty the bulb: 1. Release the plug on the top of the bulb. 2. Pour the bulb's contents into a measuring container which your nurse will provide. 3. Record the time emptied and amount of drainage. Empty the drain(s) as often as your     doctor or nurse recommends.  Date                  Time                    Amount (Drain 1)                 Amount (Drain 2)  _____________________________________________________________________  _____________________________________________________________________  _____________________________________________________________________  _____________________________________________________________________  _____________________________________________________________________  _____________________________________________________________________  _____________________________________________________________________  _____________________________________________________________________  Squeezing the Jackson-Pratt Bulb- To squeeze the bulb: 1. Make sure the plug at the top of the bulb is open. 2. Squeeze the bulb tightly in your fist. You will hear air squeezing from the bulb. 3. Replace the plug while the bulb is squeezed. 4. Use a safety pin to attach the bulb to your clothing. This will keep the catheter from     pulling at the bulb insertion site.  When to call your doctor- Call your doctor if:  Drain site becomes red, swollen or hot.  You have a fever greater than 101 degrees F.  There is oozing at the drain site.  Drain falls out (apply a guaze bandage over the drain hole and secure it with tape).  Drainage increases daily not related to activity patterns. (You will usually have more drainage when you  are active than when you are resting.)  Drainage has a bad odor.     JP Drain [employer] this sheet to all of your post-operative appointments while you have your drains.  Please measure your drains by CC's or ML's.  Make sure  you drain and measure your JP Drains 2 or 3 times per day.  At the end of each day, add up totals for the left side and add up totals for the right side.    ( 9 am )     ( 3 pm )        ( 9 pm )                Date L  R  L  R  L  R  Total L/R

## 2020-01-06 NOTE — Anesthesia Preprocedure Evaluation (Signed)
Anesthesia Evaluation  Patient identified by MRN, date of birth, ID band Patient awake    Reviewed: Allergy & Precautions, NPO status , Patient's Chart, lab work & pertinent test results  Airway Mallampati: II  TM Distance: >3 FB Neck ROM: Full    Dental no notable dental hx.    Pulmonary sleep apnea ,    Pulmonary exam normal breath sounds clear to auscultation       Cardiovascular hypertension, Pt. on medications negative cardio ROS Normal cardiovascular exam Rhythm:Regular Rate:Normal     Neuro/Psych Anxiety Depression negative neurological ROS  negative psych ROS   GI/Hepatic negative GI ROS, Neg liver ROS,   Endo/Other  Hypothyroidism   Renal/GU negative Renal ROS  negative genitourinary   Musculoskeletal negative musculoskeletal ROS (+)   Abdominal   Peds negative pediatric ROS (+)  Hematology negative hematology ROS (+)   Anesthesia Other Findings   Reproductive/Obstetrics negative OB ROS                             Anesthesia Physical Anesthesia Plan  ASA: II  Anesthesia Plan: General   Post-op Pain Management:    Induction: Intravenous  PONV Risk Score and Plan: 3 and Ondansetron, Dexamethasone, Midazolam and Treatment may vary due to age or medical condition  Airway Management Planned: Oral ETT  Additional Equipment:   Intra-op Plan:   Post-operative Plan: Extubation in OR  Informed Consent: I have reviewed the patients History and Physical, chart, labs and discussed the procedure including the risks, benefits and alternatives for the proposed anesthesia with the patient or authorized representative who has indicated his/her understanding and acceptance.     Dental advisory given  Plan Discussed with: CRNA  Anesthesia Plan Comments:         Anesthesia Quick Evaluation

## 2020-01-06 NOTE — Anesthesia Procedure Notes (Addendum)

## 2020-01-06 NOTE — Anesthesia Postprocedure Evaluation (Signed)
Anesthesia Post Note  Patient: Rhonda Wilkerson  Procedure(s) Performed: MAMMARY REDUCTION  (BREAST) (Bilateral Breast)     Patient location during evaluation: PACU Anesthesia Type: General Level of consciousness: awake and alert Pain management: pain level controlled Vital Signs Assessment: post-procedure vital signs reviewed and stable Respiratory status: spontaneous breathing, nonlabored ventilation and respiratory function stable Cardiovascular status: blood pressure returned to baseline and stable Postop Assessment: no apparent nausea or vomiting Anesthetic complications: no   No complications documented.  Last Vitals:  Vitals:   01/06/20 1145 01/06/20 1155  BP: (!) 151/83 (!) 148/81  Pulse: 100 95  Resp: 20 20  Temp:  (!) 36.4 C  SpO2: 96% 95%    Last Pain:  Vitals:   01/06/20 1155  TempSrc:   PainSc: 0-No pain                 Lynda Rainwater

## 2020-01-06 NOTE — Transfer of Care (Signed)
Immediate Anesthesia Transfer of Care Note  Patient: Rhonda Wilkerson  Procedure(s) Performed: MAMMARY REDUCTION  (BREAST) (Bilateral Breast)  Patient Location: PACU  Anesthesia Type:General  Level of Consciousness: awake, drowsy and patient cooperative  Airway & Oxygen Therapy: Patient Spontanous Breathing and Patient connected to face mask oxygen  Post-op Assessment: Report given to RN and Post -op Vital signs reviewed and stable  Post vital signs: Reviewed and stable  Last Vitals:  Vitals Value Taken Time  BP    Temp    Pulse    Resp 15 01/06/20 1051  SpO2    Vitals shown include unvalidated device data.  Last Pain:  Vitals:   01/06/20 0652  TempSrc: Oral  PainSc: 0-No pain      Patients Stated Pain Goal: 3 (84/98/65 1686)  Complications: No complications documented.

## 2020-01-06 NOTE — Interval H&P Note (Signed)
History and Physical Interval Note:  01/06/2020 6:55 AM  Rhonda Wilkerson  has presented today for surgery, with the diagnosis of macromastia, chronic neck and back pain, intertrigo.  The various methods of treatment have been discussed with the patient and family. After consideration of risks, benefits and other options for treatment, the patient has consented to  Procedure(s): MAMMARY REDUCTION  (BREAST) (Bilateral) as a surgical intervention.  The patient's history has been reviewed, patient examined, no change in status, stable for surgery.  I have reviewed the patient's chart and labs.  Questions were answered to the patient's satisfaction.     Arnoldo Hooker Imanii Gosdin

## 2020-01-06 NOTE — Op Note (Signed)
Operative Note   DATE OF OPERATION: 11.19.21  LOCATION: [facility]  SURGICAL DIVISION: Plastic Surgery  PREOPERATIVE DIAGNOSES:  1. Macromastia 2. Chronic neck and back pain  POSTOPERATIVE DIAGNOSES:  same  PROCEDURE:  Bilateral breast reduction  SURGEON: Irene Limbo MD MBA  ASSISTANT: none  ANESTHESIA:  General.   EBL: 45 ml  COMPLICATIONS: None immediate.   INDICATIONS FOR PROCEDURE:  The patient, Rhonda Wilkerson, is a 54 y.o. female born on December 17, 1965, is here for treatment chronic back and neck pain in setting of macromastia that has failed conservative measures.   FINDINGS: Right reduction 498 g Left 481 g  DESCRIPTION OF PROCEDURE:  The patient was marked standing in the preoperative area to mark sternal notch, chest midline, anterior axillary lines, inframammary folds. The location of new nipple areolar complex was marked at level of on inframammary fold on anterior surface breast by palpation. This was marked symmetric over bilateral breasts. With aid of Wise pattern marker, location of new nipple areolar complex and vertical limbs (6cm) were markedby displacement of breasts along meridian. The patient was taken to the operating room. SCDs were placedand IV antibiotics were given. The patient's operative site was prepped and draped in a sterile fashion. A time out was performed and all information was confirmed to be correct.   Over left breast, superomedial pedicle marked and nipple areolar complexincisedwith21mm diameter marker. Pedicle deepithlialized and developedto chest wall. Breast tissue resected over lower pole. Medial and lateral flaps developed.Additionalsuperior pole andlateral chest wall tissue excised.Breast tailor tacked closed.  I then directed attention to right breast where superomedial pedicle designed.NAC marked with64mm diameter marker.The pedicle was deepithelialized. Pedicle developed until tension free rotation was  possible.Breast tissue resected over lower pole. Medial and lateral flaps developed.Additionalsuperior pole andlateral chest wall tissue excised. Breast tailor tacked closed, and patient brought to upright sitting position and assessed for symmetry. Patent returned to supine position. Breast cavities irrigated and hemostasis obtained.Local anestheticinfiltrated throughout each breast. 15 Fr JP placed in each breast and secured with 2-0 nylon. Closure completed bilateralwith 3-0 vicryl to approximate dermis along inframammary fold and vertical limb. NAC inset with 4-0 vicryl in dermis. Skin closure completed with 4-0 monocryl subcuticular throughout. Tissue adhesive applied. Dry dressing and breast binder applied.  The patient was allowed to wake from anesthesia, extubated and taken to the recovery room in satisfactory condition.   SPECIMENS: right and left breast reduction  DRAINS: 15 Fr JP in right and left breast

## 2020-01-09 ENCOUNTER — Encounter (HOSPITAL_BASED_OUTPATIENT_CLINIC_OR_DEPARTMENT_OTHER): Payer: Self-pay | Admitting: Plastic Surgery

## 2020-01-09 LAB — SURGICAL PATHOLOGY

## 2020-01-19 DIAGNOSIS — I10 Essential (primary) hypertension: Secondary | ICD-10-CM | POA: Diagnosis not present

## 2020-01-19 DIAGNOSIS — F411 Generalized anxiety disorder: Secondary | ICD-10-CM | POA: Diagnosis not present

## 2020-01-19 DIAGNOSIS — K5090 Crohn's disease, unspecified, without complications: Secondary | ICD-10-CM | POA: Diagnosis not present

## 2020-01-19 DIAGNOSIS — Z1152 Encounter for screening for COVID-19: Secondary | ICD-10-CM | POA: Diagnosis not present

## 2020-01-29 DIAGNOSIS — G4733 Obstructive sleep apnea (adult) (pediatric): Secondary | ICD-10-CM | POA: Diagnosis not present

## 2020-02-10 DIAGNOSIS — J019 Acute sinusitis, unspecified: Secondary | ICD-10-CM | POA: Diagnosis not present

## 2020-02-10 DIAGNOSIS — F411 Generalized anxiety disorder: Secondary | ICD-10-CM | POA: Diagnosis not present

## 2020-02-10 DIAGNOSIS — Z20822 Contact with and (suspected) exposure to covid-19: Secondary | ICD-10-CM | POA: Diagnosis not present

## 2020-02-10 DIAGNOSIS — K5090 Crohn's disease, unspecified, without complications: Secondary | ICD-10-CM | POA: Diagnosis not present

## 2020-02-10 DIAGNOSIS — Z1152 Encounter for screening for COVID-19: Secondary | ICD-10-CM | POA: Diagnosis not present

## 2020-02-10 DIAGNOSIS — F39 Unspecified mood [affective] disorder: Secondary | ICD-10-CM | POA: Diagnosis not present

## 2020-02-10 DIAGNOSIS — Z23 Encounter for immunization: Secondary | ICD-10-CM | POA: Diagnosis not present

## 2020-02-10 DIAGNOSIS — E039 Hypothyroidism, unspecified: Secondary | ICD-10-CM | POA: Diagnosis not present

## 2020-02-14 DIAGNOSIS — J019 Acute sinusitis, unspecified: Secondary | ICD-10-CM | POA: Diagnosis not present

## 2020-02-14 DIAGNOSIS — F411 Generalized anxiety disorder: Secondary | ICD-10-CM | POA: Diagnosis not present

## 2020-02-14 DIAGNOSIS — Z1152 Encounter for screening for COVID-19: Secondary | ICD-10-CM | POA: Diagnosis not present

## 2020-02-14 DIAGNOSIS — F39 Unspecified mood [affective] disorder: Secondary | ICD-10-CM | POA: Diagnosis not present

## 2020-02-14 DIAGNOSIS — K5090 Crohn's disease, unspecified, without complications: Secondary | ICD-10-CM | POA: Diagnosis not present

## 2020-02-14 DIAGNOSIS — Z20822 Contact with and (suspected) exposure to covid-19: Secondary | ICD-10-CM | POA: Diagnosis not present

## 2020-02-15 ENCOUNTER — Ambulatory Visit: Payer: Self-pay | Admitting: Neurology

## 2020-02-23 ENCOUNTER — Ambulatory Visit: Payer: Self-pay | Admitting: Neurology

## 2020-02-29 DIAGNOSIS — G4733 Obstructive sleep apnea (adult) (pediatric): Secondary | ICD-10-CM | POA: Diagnosis not present

## 2020-03-01 DIAGNOSIS — K5090 Crohn's disease, unspecified, without complications: Secondary | ICD-10-CM | POA: Diagnosis not present

## 2020-03-01 DIAGNOSIS — Z1152 Encounter for screening for COVID-19: Secondary | ICD-10-CM | POA: Diagnosis not present

## 2020-03-01 DIAGNOSIS — F39 Unspecified mood [affective] disorder: Secondary | ICD-10-CM | POA: Diagnosis not present

## 2020-03-01 DIAGNOSIS — F411 Generalized anxiety disorder: Secondary | ICD-10-CM | POA: Diagnosis not present

## 2020-03-01 DIAGNOSIS — J019 Acute sinusitis, unspecified: Secondary | ICD-10-CM | POA: Diagnosis not present

## 2020-03-01 DIAGNOSIS — Z20822 Contact with and (suspected) exposure to covid-19: Secondary | ICD-10-CM | POA: Diagnosis not present

## 2020-03-05 DIAGNOSIS — F39 Unspecified mood [affective] disorder: Secondary | ICD-10-CM | POA: Diagnosis not present

## 2020-03-05 DIAGNOSIS — K5090 Crohn's disease, unspecified, without complications: Secondary | ICD-10-CM | POA: Diagnosis not present

## 2020-03-05 DIAGNOSIS — E785 Hyperlipidemia, unspecified: Secondary | ICD-10-CM | POA: Diagnosis not present

## 2020-03-05 DIAGNOSIS — Z1152 Encounter for screening for COVID-19: Secondary | ICD-10-CM | POA: Diagnosis not present

## 2020-03-05 DIAGNOSIS — I10 Essential (primary) hypertension: Secondary | ICD-10-CM | POA: Diagnosis not present

## 2020-03-05 DIAGNOSIS — F411 Generalized anxiety disorder: Secondary | ICD-10-CM | POA: Diagnosis not present

## 2020-03-19 DIAGNOSIS — F39 Unspecified mood [affective] disorder: Secondary | ICD-10-CM | POA: Diagnosis not present

## 2020-03-19 DIAGNOSIS — Z1152 Encounter for screening for COVID-19: Secondary | ICD-10-CM | POA: Diagnosis not present

## 2020-03-19 DIAGNOSIS — E785 Hyperlipidemia, unspecified: Secondary | ICD-10-CM | POA: Diagnosis not present

## 2020-03-19 DIAGNOSIS — I10 Essential (primary) hypertension: Secondary | ICD-10-CM | POA: Diagnosis not present

## 2020-03-19 DIAGNOSIS — F411 Generalized anxiety disorder: Secondary | ICD-10-CM | POA: Diagnosis not present

## 2020-03-19 DIAGNOSIS — K5090 Crohn's disease, unspecified, without complications: Secondary | ICD-10-CM | POA: Diagnosis not present

## 2020-03-31 DIAGNOSIS — G4733 Obstructive sleep apnea (adult) (pediatric): Secondary | ICD-10-CM | POA: Diagnosis not present

## 2020-04-16 DIAGNOSIS — Z1152 Encounter for screening for COVID-19: Secondary | ICD-10-CM | POA: Diagnosis not present

## 2020-04-16 DIAGNOSIS — E039 Hypothyroidism, unspecified: Secondary | ICD-10-CM | POA: Diagnosis not present

## 2020-04-16 DIAGNOSIS — R6882 Decreased libido: Secondary | ICD-10-CM | POA: Diagnosis not present

## 2020-04-16 DIAGNOSIS — I10 Essential (primary) hypertension: Secondary | ICD-10-CM | POA: Diagnosis not present

## 2020-04-16 DIAGNOSIS — F339 Major depressive disorder, recurrent, unspecified: Secondary | ICD-10-CM | POA: Diagnosis not present

## 2020-04-16 DIAGNOSIS — F411 Generalized anxiety disorder: Secondary | ICD-10-CM | POA: Diagnosis not present

## 2020-04-16 DIAGNOSIS — K5090 Crohn's disease, unspecified, without complications: Secondary | ICD-10-CM | POA: Diagnosis not present

## 2020-04-17 DIAGNOSIS — R6882 Decreased libido: Secondary | ICD-10-CM | POA: Diagnosis not present

## 2020-04-17 DIAGNOSIS — E785 Hyperlipidemia, unspecified: Secondary | ICD-10-CM | POA: Diagnosis not present

## 2020-04-17 DIAGNOSIS — E042 Nontoxic multinodular goiter: Secondary | ICD-10-CM | POA: Diagnosis not present

## 2020-04-17 DIAGNOSIS — E039 Hypothyroidism, unspecified: Secondary | ICD-10-CM | POA: Diagnosis not present

## 2020-04-17 DIAGNOSIS — Z0000 Encounter for general adult medical examination without abnormal findings: Secondary | ICD-10-CM | POA: Diagnosis not present

## 2020-04-17 DIAGNOSIS — Z1152 Encounter for screening for COVID-19: Secondary | ICD-10-CM | POA: Diagnosis not present

## 2020-04-17 DIAGNOSIS — K5090 Crohn's disease, unspecified, without complications: Secondary | ICD-10-CM | POA: Diagnosis not present

## 2020-04-17 DIAGNOSIS — I10 Essential (primary) hypertension: Secondary | ICD-10-CM | POA: Diagnosis not present

## 2020-04-17 DIAGNOSIS — Z0001 Encounter for general adult medical examination with abnormal findings: Secondary | ICD-10-CM | POA: Diagnosis not present

## 2020-04-17 DIAGNOSIS — F411 Generalized anxiety disorder: Secondary | ICD-10-CM | POA: Diagnosis not present

## 2020-04-17 DIAGNOSIS — G4733 Obstructive sleep apnea (adult) (pediatric): Secondary | ICD-10-CM | POA: Diagnosis not present

## 2020-04-28 DIAGNOSIS — G4733 Obstructive sleep apnea (adult) (pediatric): Secondary | ICD-10-CM | POA: Diagnosis not present

## 2020-05-29 DIAGNOSIS — G4733 Obstructive sleep apnea (adult) (pediatric): Secondary | ICD-10-CM | POA: Diagnosis not present

## 2020-06-20 DIAGNOSIS — H9313 Tinnitus, bilateral: Secondary | ICD-10-CM | POA: Diagnosis not present

## 2020-06-20 DIAGNOSIS — F32A Depression, unspecified: Secondary | ICD-10-CM | POA: Diagnosis not present

## 2020-06-20 DIAGNOSIS — I8390 Asymptomatic varicose veins of unspecified lower extremity: Secondary | ICD-10-CM | POA: Diagnosis not present

## 2020-06-20 DIAGNOSIS — H6983 Other specified disorders of Eustachian tube, bilateral: Secondary | ICD-10-CM | POA: Diagnosis not present

## 2020-06-28 DIAGNOSIS — G4733 Obstructive sleep apnea (adult) (pediatric): Secondary | ICD-10-CM | POA: Diagnosis not present

## 2020-07-29 DIAGNOSIS — G4733 Obstructive sleep apnea (adult) (pediatric): Secondary | ICD-10-CM | POA: Diagnosis not present

## 2020-08-07 ENCOUNTER — Other Ambulatory Visit: Payer: Self-pay | Admitting: Physician Assistant

## 2020-08-07 DIAGNOSIS — R197 Diarrhea, unspecified: Secondary | ICD-10-CM | POA: Diagnosis not present

## 2020-08-07 DIAGNOSIS — K50119 Crohn's disease of large intestine with unspecified complications: Secondary | ICD-10-CM

## 2020-08-07 DIAGNOSIS — K5080 Crohn's disease of both small and large intestine without complications: Secondary | ICD-10-CM | POA: Diagnosis not present

## 2020-08-07 DIAGNOSIS — G4733 Obstructive sleep apnea (adult) (pediatric): Secondary | ICD-10-CM | POA: Diagnosis not present

## 2020-08-07 DIAGNOSIS — K5901 Slow transit constipation: Secondary | ICD-10-CM | POA: Diagnosis not present

## 2020-08-23 ENCOUNTER — Other Ambulatory Visit: Payer: Self-pay

## 2020-08-23 ENCOUNTER — Ambulatory Visit
Admission: RE | Admit: 2020-08-23 | Discharge: 2020-08-23 | Disposition: A | Discharge status: Home / Self Care | Payer: PPO | Source: Ambulatory Visit | Attending: Physician Assistant | Admitting: Physician Assistant

## 2020-08-23 DIAGNOSIS — K529 Noninfective gastroenteritis and colitis, unspecified: Secondary | ICD-10-CM | POA: Diagnosis not present

## 2020-08-23 DIAGNOSIS — K5900 Constipation, unspecified: Secondary | ICD-10-CM | POA: Diagnosis not present

## 2020-08-23 DIAGNOSIS — D1771 Benign lipomatous neoplasm of kidney: Secondary | ICD-10-CM | POA: Diagnosis not present

## 2020-08-23 DIAGNOSIS — K50119 Crohn's disease of large intestine with unspecified complications: Secondary | ICD-10-CM

## 2020-08-23 DIAGNOSIS — R197 Diarrhea, unspecified: Secondary | ICD-10-CM | POA: Diagnosis not present

## 2020-08-23 DIAGNOSIS — K6289 Other specified diseases of anus and rectum: Secondary | ICD-10-CM | POA: Diagnosis not present

## 2020-08-23 MED ORDER — IOPAMIDOL (ISOVUE-300) INJECTION 61%
100.0000 mL | Freq: Once | INTRAVENOUS | Status: AC | PRN
  Administered 2020-08-23: 100 mL via INTRAVENOUS

## 2020-08-28 DIAGNOSIS — G4733 Obstructive sleep apnea (adult) (pediatric): Secondary | ICD-10-CM | POA: Diagnosis not present

## 2020-09-06 DIAGNOSIS — Z9889 Other specified postprocedural states: Secondary | ICD-10-CM | POA: Diagnosis not present

## 2020-09-24 NOTE — H&P (Signed)
Subjective:     Patient ID: Rhonda Wilkerson is a 55 y.o. female.   Follow-up     Nearly months post op breast reduction. Patient scheduled for revision bilateral breasts. Notes breasts have come down and overall volume greater than she desires.   Recently had CT AP entero for follow up Crohns. Of note right kidney angiomyolipoma of the noted be enlarged since last exam and Urology consult recommended.   Prior 64/40 C/D. Right reduction 498 g Left 481 g   Wt stable   MMG 12/2019 [facility]. Notes has had prior left breast biopsy 2011 benign. Per patient multiple right breast cysts removed. Mother and PA with breast ca.   PMH significant for Crohns in remission, OSA, HTN, HLD, hypothyroid. Crohns and Stelara managed by Dr. Watt Wilkerson.   Not working. Lives with partner who herself had breast reduction x 2.     Review of Systems    Objective:   Physical Exam Cardiovascular:     Rate and Rhythm: Normal rate and regular rhythm.     Pulses: Normal pulses.     Heart sounds: Normal heart sounds.  Pulmonary:     Breath sounds: Normal breath sounds.  Chest:  Breasts:     Right: No axillary adenopathy.     Left: No axillary adenopathy.      Lymphadenopathy:     Upper Body:     Right upper body: No axillary adenopathy.     Left upper body: No axillary adenopathy.       Chest: Symmetric shape and contour NAC scars are raised blanch with pressure psuedoptosis bilateral present Sn to nipple R 20 L 20 cm BW R 20 L 20 cm  Nipple to IMF R 7 cm to scar 9 cm to IMF L 7 cm to scar 9 cm to IMF Notes no sensation NACs    Assessment:     Macromastia s/p reduction mammaplasty    Plan:       Reviewed with patient position of NAC stable at location set at surgery. Has pseudoptosis and any additional skin resection would result I expect in again repeat stretching skin and descent breast on chest with time. Reviewed I would not recommend any further lift NAC position; reviewed risk of this would be  visible NAC in bra or clothing.   Plan additional skin and breast tissue excision with reopening/use of vertical and NAC scar and limited amount IMF scar. Discussed lateral chest soft tissue can be excised but will not be completely flat in contour.Reviewed this will start process scar maturation over again. Reviewed surgery based on maintaining a mound of breast tissue with NAC to preserve its blood supply. Counseled the anticipated amount breast tissue that can be resected and leave viable NAC will be significantly less than already resected with reduction surgery.   Encouraged patient and her partner to get another opinion if desired before proceeding with additional surgery. Patient declined.   Desires to be as small as she can "without killing nipple." Reviewed NAC necrosis will always be a risk with this procedure.    Additional risks including but not limited to bleeding infection seroma hematoma damage to adjacent structures wound healing problems blood clots in legs or lungs reviewed.    Reviewed OP surgery, possible drains. Rx for Percocet given.

## 2020-09-28 DIAGNOSIS — G4733 Obstructive sleep apnea (adult) (pediatric): Secondary | ICD-10-CM | POA: Diagnosis not present

## 2020-10-01 ENCOUNTER — Other Ambulatory Visit: Payer: Self-pay

## 2020-10-01 ENCOUNTER — Encounter (HOSPITAL_BASED_OUTPATIENT_CLINIC_OR_DEPARTMENT_OTHER): Payer: Self-pay | Admitting: Plastic Surgery

## 2020-10-03 ENCOUNTER — Encounter (HOSPITAL_BASED_OUTPATIENT_CLINIC_OR_DEPARTMENT_OTHER)
Admission: RE | Admit: 2020-10-03 | Discharge: 2020-10-03 | Disposition: A | Discharge status: Home / Self Care | Payer: PPO | Source: Ambulatory Visit | Attending: Plastic Surgery | Admitting: Plastic Surgery

## 2020-10-03 DIAGNOSIS — Z01812 Encounter for preprocedural laboratory examination: Secondary | ICD-10-CM | POA: Diagnosis not present

## 2020-10-03 LAB — BASIC METABOLIC PANEL
Anion gap: 8 (ref 5–15)
BUN: 12 mg/dL (ref 6–20)
CO2: 30 mmol/L (ref 22–32)
Calcium: 9.4 mg/dL (ref 8.9–10.3)
Chloride: 102 mmol/L (ref 98–111)
Creatinine, Ser: 0.67 mg/dL (ref 0.44–1.00)
GFR, Estimated: >60 mL/min (ref >60)
Glucose, Bld: 92 mg/dL (ref 70–99)
Potassium: 3.4 mmol/L — ABNORMAL LOW (ref 3.5–5.1)
Sodium: 140 mmol/L (ref 135–145)

## 2020-10-03 NOTE — Progress Notes (Signed)

## 2020-10-08 ENCOUNTER — Ambulatory Visit (HOSPITAL_BASED_OUTPATIENT_CLINIC_OR_DEPARTMENT_OTHER)
Admission: RE | Admit: 2020-10-08 | Discharge: 2020-10-08 | Disposition: A | Discharge status: Home / Self Care | Payer: PPO | Attending: Plastic Surgery | Admitting: Plastic Surgery

## 2020-10-08 ENCOUNTER — Ambulatory Visit (HOSPITAL_BASED_OUTPATIENT_CLINIC_OR_DEPARTMENT_OTHER): Payer: PPO | Admitting: Certified Registered"

## 2020-10-08 ENCOUNTER — Other Ambulatory Visit: Payer: Self-pay

## 2020-10-08 ENCOUNTER — Encounter (HOSPITAL_BASED_OUTPATIENT_CLINIC_OR_DEPARTMENT_OTHER)
Admission: RE | Disposition: A | Discharge status: Home / Self Care | Payer: Self-pay | Source: Home / Self Care | Attending: Plastic Surgery

## 2020-10-08 ENCOUNTER — Encounter (HOSPITAL_BASED_OUTPATIENT_CLINIC_OR_DEPARTMENT_OTHER): Payer: Self-pay | Admitting: Plastic Surgery

## 2020-10-08 DIAGNOSIS — N6081 Other benign mammary dysplasias of right breast: Secondary | ICD-10-CM | POA: Insufficient documentation

## 2020-10-08 DIAGNOSIS — Z803 Family history of malignant neoplasm of breast: Secondary | ICD-10-CM | POA: Insufficient documentation

## 2020-10-08 DIAGNOSIS — K5090 Crohn's disease, unspecified, without complications: Secondary | ICD-10-CM | POA: Insufficient documentation

## 2020-10-08 DIAGNOSIS — I10 Essential (primary) hypertension: Secondary | ICD-10-CM | POA: Diagnosis not present

## 2020-10-08 DIAGNOSIS — N6082 Other benign mammary dysplasias of left breast: Secondary | ICD-10-CM | POA: Diagnosis not present

## 2020-10-08 DIAGNOSIS — Z9989 Dependence on other enabling machines and devices: Secondary | ICD-10-CM | POA: Diagnosis not present

## 2020-10-08 DIAGNOSIS — E785 Hyperlipidemia, unspecified: Secondary | ICD-10-CM | POA: Insufficient documentation

## 2020-10-08 DIAGNOSIS — L905 Scar conditions and fibrosis of skin: Secondary | ICD-10-CM | POA: Diagnosis not present

## 2020-10-08 DIAGNOSIS — G4730 Sleep apnea, unspecified: Secondary | ICD-10-CM | POA: Diagnosis not present

## 2020-10-08 DIAGNOSIS — N62 Hypertrophy of breast: Secondary | ICD-10-CM | POA: Insufficient documentation

## 2020-10-08 DIAGNOSIS — E039 Hypothyroidism, unspecified: Secondary | ICD-10-CM | POA: Diagnosis not present

## 2020-10-08 DIAGNOSIS — G4733 Obstructive sleep apnea (adult) (pediatric): Secondary | ICD-10-CM | POA: Insufficient documentation

## 2020-10-08 HISTORY — PX: SCAR REVISION: SHX5285

## 2020-10-08 SURGERY — REVISION, SCAR
Anesthesia: General | Site: Breast | Laterality: Bilateral

## 2020-10-08 MED ORDER — PROPOFOL 10 MG/ML IV BOLUS
INTRAVENOUS | Status: DC | PRN
  Administered 2020-10-08: 150 mg via INTRAVENOUS

## 2020-10-08 MED ORDER — CEFAZOLIN SODIUM-DEXTROSE 2-4 GM/100ML-% IV SOLN
INTRAVENOUS | Status: AC
  Filled 2020-10-08: qty 100

## 2020-10-08 MED ORDER — HYDROMORPHONE HCL 1 MG/ML IJ SOLN
0.2500 mg | INTRAMUSCULAR | Status: DC | PRN
  Administered 2020-10-08: 0.5 mg via INTRAVENOUS

## 2020-10-08 MED ORDER — PROPOFOL 500 MG/50ML IV EMUL
INTRAVENOUS | Status: DC | PRN
Start: 2020-10-08 — End: 2020-10-08
  Administered 2020-10-08: 25 ug/kg/min via INTRAVENOUS

## 2020-10-08 MED ORDER — HYDROMORPHONE HCL 1 MG/ML IJ SOLN
INTRAMUSCULAR | Status: AC
  Filled 2020-10-08: qty 0.5

## 2020-10-08 MED ORDER — DEXAMETHASONE SODIUM PHOSPHATE 10 MG/ML IJ SOLN
INTRAMUSCULAR | Status: AC
  Filled 2020-10-08: qty 2

## 2020-10-08 MED ORDER — FENTANYL CITRATE (PF) 100 MCG/2ML IJ SOLN
INTRAMUSCULAR | Status: AC
  Filled 2020-10-08: qty 2

## 2020-10-08 MED ORDER — FENTANYL CITRATE (PF) 100 MCG/2ML IJ SOLN
INTRAMUSCULAR | Status: DC | PRN
  Administered 2020-10-08: 50 ug via INTRAVENOUS
  Administered 2020-10-08: 100 ug via INTRAVENOUS
  Administered 2020-10-08: 50 ug via INTRAVENOUS

## 2020-10-08 MED ORDER — ROCURONIUM BROMIDE 10 MG/ML (PF) SYRINGE
PREFILLED_SYRINGE | INTRAVENOUS | Status: AC
  Filled 2020-10-08: qty 10

## 2020-10-08 MED ORDER — DEXAMETHASONE SODIUM PHOSPHATE 4 MG/ML IJ SOLN
INTRAMUSCULAR | Status: DC | PRN
  Administered 2020-10-08: 5 mg via INTRAVENOUS

## 2020-10-08 MED ORDER — ACETAMINOPHEN 500 MG PO TABS
1000.0000 mg | ORAL_TABLET | ORAL | Status: AC
  Administered 2020-10-08: 1000 mg via ORAL

## 2020-10-08 MED ORDER — PROPOFOL 500 MG/50ML IV EMUL
INTRAVENOUS | Status: AC
  Filled 2020-10-08: qty 100

## 2020-10-08 MED ORDER — ONDANSETRON HCL 4 MG/2ML IJ SOLN
INTRAMUSCULAR | Status: AC
  Filled 2020-10-08: qty 8

## 2020-10-08 MED ORDER — BUPIVACAINE HCL (PF) 0.5 % IJ SOLN
INTRAMUSCULAR | Status: DC | PRN
  Administered 2020-10-08: 30 mL

## 2020-10-08 MED ORDER — ACETAMINOPHEN 500 MG PO TABS
ORAL_TABLET | ORAL | Status: AC
  Filled 2020-10-08: qty 2

## 2020-10-08 MED ORDER — 0.9 % SODIUM CHLORIDE (POUR BTL) OPTIME
TOPICAL | Status: DC | PRN
Start: 2020-10-08 — End: 2020-10-08
  Administered 2020-10-08: 300 mL

## 2020-10-08 MED ORDER — GABAPENTIN 300 MG PO CAPS
300.0000 mg | ORAL_CAPSULE | ORAL | Status: AC
  Administered 2020-10-08: 300 mg via ORAL

## 2020-10-08 MED ORDER — MIDAZOLAM HCL 2 MG/2ML IJ SOLN
INTRAMUSCULAR | Status: AC
  Filled 2020-10-08: qty 2

## 2020-10-08 MED ORDER — CHLORHEXIDINE GLUCONATE CLOTH 2 % EX PADS: 6.0000 | MEDICATED_PAD | Freq: Once | CUTANEOUS | Status: DC

## 2020-10-08 MED ORDER — MIDAZOLAM HCL 5 MG/5ML IJ SOLN
INTRAMUSCULAR | Status: DC | PRN
  Administered 2020-10-08: 2 mg via INTRAVENOUS

## 2020-10-08 MED ORDER — CELECOXIB 200 MG PO CAPS
ORAL_CAPSULE | ORAL | Status: AC
  Filled 2020-10-08: qty 1

## 2020-10-08 MED ORDER — CELECOXIB 200 MG PO CAPS
200.0000 mg | ORAL_CAPSULE | ORAL | Status: AC
  Administered 2020-10-08: 200 mg via ORAL

## 2020-10-08 MED ORDER — ONDANSETRON HCL 4 MG/2ML IJ SOLN
INTRAMUSCULAR | Status: DC | PRN
  Administered 2020-10-08: 4 mg via INTRAVENOUS

## 2020-10-08 MED ORDER — LACTATED RINGERS IV SOLN: INTRAVENOUS | Status: DC

## 2020-10-08 MED ORDER — DEXMEDETOMIDINE (PRECEDEX) IN NS 20 MCG/5ML (4 MCG/ML) IV SYRINGE
PREFILLED_SYRINGE | INTRAVENOUS | Status: AC
  Filled 2020-10-08: qty 5

## 2020-10-08 MED ORDER — LIDOCAINE HCL (PF) 2 % IJ SOLN
INTRAMUSCULAR | Status: AC
  Filled 2020-10-08: qty 10

## 2020-10-08 MED ORDER — CEFAZOLIN SODIUM-DEXTROSE 2-4 GM/100ML-% IV SOLN
2.0000 g | INTRAVENOUS | Status: AC
  Administered 2020-10-08: 2 g via INTRAVENOUS

## 2020-10-08 MED ORDER — ROCURONIUM BROMIDE 100 MG/10ML IV SOLN
INTRAVENOUS | Status: DC | PRN
  Administered 2020-10-08: 60 mg via INTRAVENOUS

## 2020-10-08 MED ORDER — LIDOCAINE HCL (CARDIAC) PF 100 MG/5ML IV SOSY
PREFILLED_SYRINGE | INTRAVENOUS | Status: DC | PRN
  Administered 2020-10-08: 60 mg via INTRAVENOUS

## 2020-10-08 MED ORDER — GABAPENTIN 300 MG PO CAPS
ORAL_CAPSULE | ORAL | Status: AC
  Filled 2020-10-08: qty 1

## 2020-10-08 MED ORDER — BUPIVACAINE HCL (PF) 0.5 % IJ SOLN
INTRAMUSCULAR | Status: AC
  Filled 2020-10-08: qty 30

## 2020-10-08 MED ORDER — EPHEDRINE 5 MG/ML INJ
INTRAVENOUS | Status: AC
  Filled 2020-10-08: qty 10

## 2020-10-08 SURGICAL SUPPLY — 61 items
ADH SKN CLS APL DERMABOND .7 (GAUZE/BANDAGES/DRESSINGS) ×1
APL PRP STRL LF DISP 70% ISPRP (MISCELLANEOUS) ×2
APL SKNCLS STERI-STRIP NONHPOA (GAUZE/BANDAGES/DRESSINGS)
BENZOIN TINCTURE PRP APPL 2/3 (GAUZE/BANDAGES/DRESSINGS) IMPLANT
BINDER BREAST XLRG (GAUZE/BANDAGES/DRESSINGS) ×2 IMPLANT
BLADE CLIPPER SURG (BLADE) IMPLANT
BLADE SURG 10 STRL SS (BLADE) ×6 IMPLANT
BLADE SURG 11 STRL SS (BLADE) IMPLANT
BLADE SURG 15 STRL LF DISP TIS (BLADE) IMPLANT
BLADE SURG 15 STRL SS (BLADE)
BNDG GAUZE ELAST 4 BULKY (GAUZE/BANDAGES/DRESSINGS) ×4 IMPLANT
CANISTER SUCT 1200ML W/VALVE (MISCELLANEOUS) ×2 IMPLANT
CHLORAPREP W/TINT 26 (MISCELLANEOUS) ×4 IMPLANT
COVER BACK TABLE 60X90IN (DRAPES) ×2 IMPLANT
COVER MAYO STAND STRL (DRAPES) ×2 IMPLANT
DERMABOND ADVANCED (GAUZE/BANDAGES/DRESSINGS) ×1
DERMABOND ADVANCED .7 DNX12 (GAUZE/BANDAGES/DRESSINGS) ×1 IMPLANT
DRAIN CHANNEL 15F RND FF W/TCR (WOUND CARE) IMPLANT
DRAPE LAPAROSCOPIC ABDOMINAL (DRAPES) IMPLANT
DRAPE TOP ARMCOVERS (MISCELLANEOUS) ×2 IMPLANT
DRAPE U-SHAPE 76X120 STRL (DRAPES) ×2 IMPLANT
DRAPE UTILITY XL STRL (DRAPES) ×2 IMPLANT
DRSG PAD ABDOMINAL 8X10 ST (GAUZE/BANDAGES/DRESSINGS) ×4 IMPLANT
DRSG TELFA 3X8 NADH (GAUZE/BANDAGES/DRESSINGS) IMPLANT
ELECT COATED BLADE 2.86 ST (ELECTRODE) ×2 IMPLANT
ELECT NEEDLE BLADE 2-5/6 (NEEDLE) IMPLANT
ELECT REM PT RETURN 9FT ADLT (ELECTROSURGICAL) ×2
ELECT REM PT RETURN 9FT PED (ELECTROSURGICAL)
ELECTRODE REM PT RETRN 9FT PED (ELECTROSURGICAL) IMPLANT
ELECTRODE REM PT RTRN 9FT ADLT (ELECTROSURGICAL) ×1 IMPLANT
EVACUATOR SILICONE 100CC (DRAIN) IMPLANT
GAUZE SPONGE 4X4 12PLY STRL LF (GAUZE/BANDAGES/DRESSINGS) IMPLANT
GAUZE XEROFORM 1X8 LF (GAUZE/BANDAGES/DRESSINGS) IMPLANT
GLOVE SURG HYDRASOFT LTX SZ5.5 (GLOVE) ×2 IMPLANT
GOWN STRL REUS W/ TWL LRG LVL3 (GOWN DISPOSABLE) ×2 IMPLANT
GOWN STRL REUS W/TWL LRG LVL3 (GOWN DISPOSABLE) ×4
MARKER SKIN DUAL TIP RULER LAB (MISCELLANEOUS) ×2 IMPLANT
NEEDLE HYPO 25X1 1.5 SAFETY (NEEDLE) ×2 IMPLANT
NEEDLE PRECISIONGLIDE 27X1.5 (NEEDLE) IMPLANT
NS IRRIG 1000ML POUR BTL (IV SOLUTION) ×2 IMPLANT
PACK BASIN DAY SURGERY FS (CUSTOM PROCEDURE TRAY) ×2 IMPLANT
PENCIL SMOKE EVACUATOR (MISCELLANEOUS) ×2 IMPLANT
SHEET MEDIUM DRAPE 40X70 STRL (DRAPES) ×2 IMPLANT
SLEEVE SCD COMPRESS KNEE MED (STOCKING) ×2 IMPLANT
SPONGE GAUZE 2X2 8PLY STRL LF (GAUZE/BANDAGES/DRESSINGS) IMPLANT
SPONGE T-LAP 18X18 ~~LOC~~+RFID (SPONGE) ×6 IMPLANT
STAPLER VISISTAT 35W (STAPLE) ×4 IMPLANT
STRIP CLOSURE SKIN 1/2X4 (GAUZE/BANDAGES/DRESSINGS) IMPLANT
SUT ETHILON 4 0 PS 2 18 (SUTURE) ×2 IMPLANT
SUT MNCRL AB 4-0 PS2 18 (SUTURE) ×8 IMPLANT
SUT MON AB 5-0 P3 18 (SUTURE) IMPLANT
SUT VIC AB 3-0 PS1 18 (SUTURE) ×12
SUT VIC AB 3-0 PS1 18XBRD (SUTURE) ×6 IMPLANT
SUT VICRYL 4-0 PS2 18IN ABS (SUTURE) ×4 IMPLANT
SYR BULB IRRIG 60ML STRL (SYRINGE) ×2 IMPLANT
SYR CONTROL 10ML LL (SYRINGE) ×2 IMPLANT
TOWEL GREEN STERILE FF (TOWEL DISPOSABLE) ×2 IMPLANT
TRAY DSU PREP LF (CUSTOM PROCEDURE TRAY) IMPLANT
TUBE CONNECTING 20X1/4 (TUBING) ×2 IMPLANT
UNDERPAD 30X36 HEAVY ABSORB (UNDERPADS AND DIAPERS) ×4 IMPLANT
YANKAUER SUCT BULB TIP NO VENT (SUCTIONS) ×4 IMPLANT

## 2020-10-08 NOTE — Anesthesia Procedure Notes (Signed)
Procedure Name: Intubation Date/Time: 10/08/2020 7:27 AM Performed by: Signe Colt, CRNA Pre-anesthesia Checklist: Patient identified, Emergency Drugs available, Suction available and Patient being monitored Patient Re-evaluated:Patient Re-evaluated prior to induction Oxygen Delivery Method: Circle system utilized Preoxygenation: Pre-oxygenation with 100% oxygen Induction Type: IV induction Ventilation: Mask ventilation without difficulty Laryngoscope Size: Mac and 3 Grade View: Grade I Tube type: Oral Tube size: 7.0 mm Number of attempts: 1 Airway Equipment and Method: Stylet and Oral airway Placement Confirmation: ETT inserted through vocal cords under direct vision, positive ETCO2 and breath sounds checked- equal and bilateral Secured at: 21 cm Tube secured with: Tape Dental Injury: Teeth and Oropharynx as per pre-operative assessment

## 2020-10-08 NOTE — Interval H&P Note (Signed)
History and Physical Interval Note:  10/08/2020 6:55 AM  Rhonda Wilkerson  has presented today for surgery, with the diagnosis of scar breasts.  The various methods of treatment have been discussed with the patient and family. After consideration of risks, benefits and other options for treatment, the patient has consented to  Procedure(s): SCAR REVISION BILATERAL BREASTS (Bilateral) as a surgical intervention.  The patient's history has been reviewed, patient examined, no change in status, stable for surgery.  I have reviewed the patient's chart and labs.  Questions were answered to the patient's satisfaction.     Arnoldo Hooker Eloni Darius

## 2020-10-08 NOTE — Transfer of Care (Signed)
Immediate Anesthesia Transfer of Care Note  Patient: Rhonda Wilkerson  Procedure(s) Performed: SCAR REVISION BILATERAL BREASTS (Bilateral: Breast)  Patient Location: PACU  Anesthesia Type:General  Level of Consciousness: drowsy and patient cooperative  Airway & Oxygen Therapy: Patient Spontanous Breathing and Patient connected to face mask oxygen  Post-op Assessment: Report given to RN and Post -op Vital signs reviewed and stable  Post vital signs: Reviewed and stable  Last Vitals:  Vitals Value Taken Time  BP 126/73 10/08/20 1112  Temp    Pulse 84 10/08/20 1113  Resp 20 10/08/20 1113  SpO2 96 % 10/08/20 1113  Vitals shown include unvalidated device data.  Last Pain:  Vitals:   10/08/20 0638  TempSrc: Oral  PainSc: 0-No pain      Patients Stated Pain Goal: 2 ([id number])  Complications: No notable events documented.

## 2020-10-08 NOTE — Anesthesia Preprocedure Evaluation (Addendum)
Anesthesia Evaluation  Patient identified by MRN, date of birth, ID band Patient awake    Reviewed: Allergy & Precautions, NPO status , Patient's Chart, lab work & pertinent test results  Airway Mallampati: II  TM Distance: >3 FB     Dental   Pulmonary sleep apnea ,    breath sounds clear to auscultation       Cardiovascular hypertension,  Rhythm:Regular Rate:Normal     Neuro/Psych    GI/Hepatic Neg liver ROS, History noted Dr. Nyoka Cowden   Endo/Other  Hypothyroidism   Renal/GU negative Renal ROS     Musculoskeletal   Abdominal   Peds  Hematology   Anesthesia Other Findings   Reproductive/Obstetrics                            Anesthesia Physical Anesthesia Plan  ASA: 3  Anesthesia Plan: General   Post-op Pain Management:    Induction: Intravenous  PONV Risk Score and Plan: 3 and Ondansetron, Dexamethasone and Midazolam  Airway Management Planned: Oral ETT  Additional Equipment:   Intra-op Plan:   Post-operative Plan: Extubation in OR  Informed Consent: I have reviewed the patients History and Physical, chart, labs and discussed the procedure including the risks, benefits and alternatives for the proposed anesthesia with the patient or authorized representative who has indicated his/her understanding and acceptance.       Plan Discussed with: CRNA and Anesthesiologist  Anesthesia Plan Comments:         Anesthesia Quick Evaluation

## 2020-10-08 NOTE — Discharge Instructions (Addendum)
May take Tylenol after 12:40 pm, if needed May take NSAIDS (Ibuprofen, Motrin) after 12:40 pm, if needed.    Post Anesthesia Home Care Instructions  Activity: Get plenty of rest for the remainder of the day. A responsible individual must stay with you for 24 hours following the procedure.  For the next 24 hours, DO NOT: -Drive a car -Paediatric nurse -Drink alcoholic beverages -Take any medication unless instructed by your physician -Make any legal decisions or sign important papers.  Meals: Start with liquid foods such as gelatin or soup. Progress to regular foods as tolerated. Avoid greasy, spicy, heavy foods. If nausea and/or vomiting occur, drink only clear liquids until the nausea and/or vomiting subsides. Call your physician if vomiting continues.  Special Instructions/Symptoms: Your throat may feel dry or sore from the anesthesia or the breathing tube placed in your throat during surgery. If this causes discomfort, gargle with warm salt water. The discomfort should disappear within 24 hours.  If you had a scopolamine patch placed behind your ear for the management of post- operative nausea and/or vomiting:  1. The medication in the patch is effective for 72 hours, after which it should be removed.  Wrap patch in a tissue and discard in the trash. Wash hands thoroughly with soap and water. 2. You may remove the patch earlier than 72 hours if you experience unpleasant side effects which may include dry mouth, dizziness or visual disturbances. 3. Avoid touching the patch. Wash your hands with soap and water after contact with the patch.          JP Drain [employer] this sheet to all of your post-operative appointments while you have your drains. Please measure your drains by CC's or ML's. Make sure you drain and measure your JP Drains 2 or 3 times per day. At the end of each day, add up totals for the left side and add up totals for the right side.    ( 9 am )     ( 3 pm  )        ( 9 pm )                Date L  R  L  R  L  R  Total L/R

## 2020-10-08 NOTE — Op Note (Signed)
Operative Note   DATE OF OPERATION: 8.22.22  LOCATION: [facility]-outpatient  SURGICAL DIVISION: Plastic Surgery  PREOPERATIVE DIAGNOSES:  1. History bilateral breast reduction  POSTOPERATIVE DIAGNOSES:  same  PROCEDURE:  Adjacent tissue transfer chest 150 cm2  SURGEON: Irene Limbo MD MBA  ASSISTANT: none  ANESTHESIA:  General.   EBL: 25 ml  COMPLICATIONS: None immediate.   INDICATIONS FOR PROCEDURE:  The patient, Rhonda Wilkerson, is a 55 y.o. female born on [birth year], is here for revision bilateral breast reduction for concern too large volume.   FINDINGS: right breast tissue 229 g Left breast tissue 205 g  DESCRIPTION OF PROCEDURE:  The patient's operative site was marked with the patient in the preoperative area including anterior axillary lines, breast meridians. The vertical limbs for resection marked by displacement breast along meridian and set at 6 cm length. The patient was taken to the operating room. SCDs were placed and IV antibiotics were given. The patient's operative site was prepped and draped in a sterile fashion. A time out was performed and all information was confirmed to be correct.   Over left breast superior pedicle designed and skin in this area de epithelialized. Lateral limbs for resection marked in supine position and included excision prior IMF scar. The nipple areolar complex was not elevated. Lower pole tissue resected and defect created 5 x 15 cm. Medial and lateral soft tissue flaps created.   Over right breast superior pedicle designed and skin in this area de epithelialized. Lateral limbs for resection marked in supine position and included excision prior IMF scar. The nipple areolar complex was not elevated. Lower pole tissue resected and defect created 5 x 15. Medial and lateral soft tissue flaps created.   Patient brought to upright sitting position and examined for symmetry. Patient returned to supine position. Breast cavities  irrigated with saline and hemostasis obtained. Local anesthetic infiltrated throughout each breast. 15 Fr JP placed in each breast and secured with 2-0 nylon.   Over left breast, defect closed with transfer medial and lateral flaps inferiorly and medially. Closure completed with 3-0 vicryl to approximate dermis along inframammary fold and vertical limb. NAC inset with 4-0 vicryl in dermis. Skin closure completed with 4-0 monocryl subcuticular throughout.   Over right breast, defect closed with transfer medial and lateral flaps inferiorly and medially. Closure completed with 3-0 vicryl to approximate dermis along inframammary fold and vertical limb. NAC inset with 4-0 vicryl in dermis. Skin closure completed with 4-0 monocryl subcuticular throughout. Tissue adhesive applied. Dry dressing and breast binder applied.  The patient was allowed to wake from anesthesia, extubated and taken to the recovery room in satisfactory condition.   SPECIMENS: right and left breast tissue  DRAINS: 15 Fr JP in right and left breast

## 2020-10-09 ENCOUNTER — Encounter (HOSPITAL_BASED_OUTPATIENT_CLINIC_OR_DEPARTMENT_OTHER): Payer: Self-pay | Admitting: Plastic Surgery

## 2020-10-09 DIAGNOSIS — D3001 Benign neoplasm of right kidney: Secondary | ICD-10-CM | POA: Diagnosis not present

## 2020-10-09 LAB — SURGICAL PATHOLOGY

## 2020-10-10 NOTE — Anesthesia Postprocedure Evaluation (Signed)
Anesthesia Post Note  Patient: Rhonda Wilkerson  Procedure(s) Performed: SCAR REVISION BILATERAL BREASTS (Bilateral: Breast)     Patient location during evaluation: PACU Anesthesia Type: General Level of consciousness: awake and alert Pain management: pain level controlled Vital Signs Assessment: post-procedure vital signs reviewed and stable Respiratory status: spontaneous breathing, nonlabored ventilation, respiratory function stable and patient connected to nasal cannula oxygen Cardiovascular status: blood pressure returned to baseline and stable Postop Assessment: no apparent nausea or vomiting Anesthetic complications: no   No notable events documented.            Effie Berkshire

## 2020-10-10 NOTE — Addendum Note (Signed)
Addendum  created 10/10/20 0825 by Effie Berkshire, MD   Attestation recorded in [place], [place] filed

## 2020-10-25 DIAGNOSIS — H9313 Tinnitus, bilateral: Secondary | ICD-10-CM | POA: Diagnosis not present

## 2020-10-25 DIAGNOSIS — I10 Essential (primary) hypertension: Secondary | ICD-10-CM | POA: Diagnosis not present

## 2020-10-25 DIAGNOSIS — K5090 Crohn's disease, unspecified, without complications: Secondary | ICD-10-CM | POA: Diagnosis not present

## 2020-10-25 DIAGNOSIS — G4733 Obstructive sleep apnea (adult) (pediatric): Secondary | ICD-10-CM | POA: Diagnosis not present

## 2020-10-25 DIAGNOSIS — E785 Hyperlipidemia, unspecified: Secondary | ICD-10-CM | POA: Diagnosis not present

## 2020-10-25 DIAGNOSIS — E039 Hypothyroidism, unspecified: Secondary | ICD-10-CM | POA: Diagnosis not present

## 2020-10-25 DIAGNOSIS — Z01419 Encounter for gynecological examination (general) (routine) without abnormal findings: Secondary | ICD-10-CM | POA: Diagnosis not present

## 2020-10-25 DIAGNOSIS — E559 Vitamin D deficiency, unspecified: Secondary | ICD-10-CM | POA: Diagnosis not present

## 2020-10-25 DIAGNOSIS — F411 Generalized anxiety disorder: Secondary | ICD-10-CM | POA: Diagnosis not present

## 2020-10-25 DIAGNOSIS — F09 Unspecified mental disorder due to known physiological condition: Secondary | ICD-10-CM | POA: Diagnosis not present

## 2020-10-29 DIAGNOSIS — G4733 Obstructive sleep apnea (adult) (pediatric): Secondary | ICD-10-CM | POA: Diagnosis not present

## 2020-10-31 ENCOUNTER — Other Ambulatory Visit: Payer: Self-pay | Admitting: Urology

## 2020-11-05 DIAGNOSIS — H9313 Tinnitus, bilateral: Secondary | ICD-10-CM | POA: Diagnosis not present

## 2020-11-05 DIAGNOSIS — H903 Sensorineural hearing loss, bilateral: Secondary | ICD-10-CM | POA: Diagnosis not present

## 2020-11-28 DIAGNOSIS — G4733 Obstructive sleep apnea (adult) (pediatric): Secondary | ICD-10-CM | POA: Diagnosis not present

## 2020-12-03 ENCOUNTER — Encounter (HOSPITAL_COMMUNITY): Payer: Self-pay

## 2020-12-04 DIAGNOSIS — Z980 Intestinal bypass and anastomosis status: Secondary | ICD-10-CM | POA: Diagnosis not present

## 2020-12-04 DIAGNOSIS — K649 Unspecified hemorrhoids: Secondary | ICD-10-CM | POA: Diagnosis not present

## 2020-12-04 DIAGNOSIS — K5010 Crohn's disease of large intestine without complications: Secondary | ICD-10-CM | POA: Diagnosis not present

## 2020-12-04 LAB — HM COLONOSCOPY

## 2020-12-04 NOTE — Progress Notes (Addendum)
Anesthesia Review:  PCP: DR Horald Pollen  Cardiologist : none  Chest x-ray : EKG : 01/03/2020  and 12/25/20.   Echo : Stress test: Cardiac Cath :  Activity level: can do a flight of stiars without difficulty  Sleep Study/ CPAP : has cpap  Fasting Blood Sugar :      / Checks Blood Sugar -- times a day:   Blood Thinner/ Instructions /Last Dose: ASA / Instructions/ Last Dose :   Burgess Estelle at [facility] and pt does have bowel prep instructions along with being on a clear liquid diet.  Called pam Noberto Retort back and asked her to fax me a copy of bowel prep instructions.  Pt forgot preop appt and med hx and preop instrucitions done via phone.  PT to come in on 12/25/20 for labs and then go for covid test Pam gibson to fax copy of bowel prep instructions to [facility] and nurse to call pt to make sure she has copy .   Covid test 12/25/2020  Called pt back and verified that she does have copy of Miralax bowel prep from surgeon office on 12/07/20.

## 2020-12-04 NOTE — Progress Notes (Addendum)
DUE TO COVID-19 ONLY ONE VISITOR IS ALLOWED TO COME WITH YOU AND STAY IN THE WAITING ROOM ONLY DURING PRE OP AND PROCEDURE DAY OF SURGERY. THE 1 VISITOR  MAY VISIT WITH YOU AFTER SURGERY IN YOUR PRIVATE ROOM DURING VISITING HOURS ONLY!  YOU NEED TO HAVE A COVID 19 TEST ON___  12/25/2020 ____ @_______ , THIS TEST MUST BE DONE BEFORE SURGERY,  COVID TESTING SITE IS AT [place]. PLEASE REMAIN IN YOUR CAR THIS IS A DRIVER UP TEST. AFTER YOUR COVID TEST PLEASE WEAR A MASK OUT IN PUBLIC AND SOCIAL DISTANCE AND [place] YOUR HANDS FREQUENTLY. PLEASE ASK ALL YOUR CLOSE CONTACTS TO WEAR A MASK OUT IN PUBLIC AND SOCIAL DISTANCE AND [place] HANDS FREQUENTLY ALSO.               Rhonda Wilkerson  12/04/2020   Your procedure is scheduled on:  12/28/2020   Report to [facility] Main  Entrance   Report to admitting at    0515AM   Bring cpap mask andtubing.   Call this number if you have problems the morning of surgery [phone]    Remember: Do not eat food , candy gum or mints :After Midnight. You may have clear liquids from midnight until __ 0430am  Clear liquid diet the day before surgery.   Follow bowel prep instructions per surgeon.    CLEAR LIQUID DIET   Foods Allowed                                                                       Coffee and tea, regular and decaf                              Plain Jell-O any favor except red or purple                                            Fruit ices (not with fruit pulp)                                      Iced Popsicles                                     Carbonated beverages, regular and diet                                    Cranberry, grape and apple juices Sports drinks like Gatorade Lightly seasoned clear broth or consume(fat free) Sugar   _____________________________________________________________________    BRUSH YOUR TEETH MORNING OF SURGERY AND RINSE YOUR MOUTH OUT, NO CHEWING GUM CANDY OR  MINTS.     Take these medicines the morning of surgery with A SIP OF WATER: Buspar,  synthroid , lexapro   DO NOT TAKE ANY DIABETIC MEDICATIONS DAY OF YOUR SURGERY  You may not have any metal on your body including hair pins and              piercings  Do not wear jewelry, make-up, lotions, powders or perfumes, deodorant             Do not wear nail polish on your fingernails.  Do not shave  48 hours prior to surgery.              Men may shave face and neck.   Do not bring valuables to the hospital. [place].  Contacts, dentures or bridgework may not be worn into surgery.  Leave suitcase in the car. After surgery it may be brought to your room.     Patients discharged the day of surgery will not be allowed to drive home. IF YOU ARE HAVING SURGERY AND GOING HOME THE SAME DAY, YOU MUST HAVE AN ADULT TO DRIVE YOU HOME AND BE WITH YOU FOR 24 HOURS. YOU MAY GO HOME BY TAXI OR UBER OR ORTHERWISE, BUT AN ADULT MUST ACCOMPANY YOU HOME AND STAY WITH YOU FOR 24 HOURS.  Name and phone number of your driver:  Special Instructions: N/A              Please read over the following fact sheets you were given: _____________________________________________________________________  [facility] - Preparing for Surgery Before surgery, you can play an important role.  Because skin is not sterile, your skin needs to be as free of germs as possible.  You can reduce the number of germs on your skin by washing with CHG (chlorahexidine gluconate) soap before surgery.  CHG is an antiseptic cleaner which kills germs and bonds with the skin to continue killing germs even after washing. Please DO NOT use if you have an allergy to CHG or antibacterial soaps.  If your skin becomes reddened/irritated stop using the CHG and inform your nurse when you arrive at Short Stay. Do not shave (including legs and underarms) for at least 48 hours prior  to the first CHG shower.  You may shave your face/neck. Please follow these instructions carefully:  1.  Shower with CHG Soap the night before surgery and the  morning of Surgery.  2.  If you choose to wash your hair, wash your hair first as usual with your  normal  shampoo.  3.  After you shampoo, rinse your hair and body thoroughly to remove the  shampoo.                           4.  Use CHG as you would any other liquid soap.  You can apply chg directly  to the skin and wash                       Gently with a scrungie or clean washcloth.  5.  Apply the CHG Soap to your body ONLY FROM THE NECK DOWN.   Do not use on face/ open                           Wound or open sores. Avoid contact with eyes, ears mouth and genitals (private parts).  Wash face,  Genitals (private parts) with your normal soap.             6.  Wash thoroughly, paying special attention to the area where your surgery  will be performed.  7.  Thoroughly rinse your body with warm water from the neck down.  8.  DO NOT shower/wash with your normal soap after using and rinsing off  the CHG Soap.                9.  Pat yourself dry with a clean towel.            10.  Wear clean pajamas.            11.  Place clean sheets on your bed the night of your first shower and do not  sleep with pets. Day of Surgery : Do not apply any lotions/deodorants the morning of surgery.  Please wear clean clothes to the hospital/surgery center.  FAILURE TO FOLLOW THESE INSTRUCTIONS MAY RESULT IN THE CANCELLATION OF YOUR SURGERY PATIENT SIGNATURE_________________________________  NURSE SIGNATURE__________________________________  ________________________________________________________________________

## 2020-12-05 DIAGNOSIS — H16223 Keratoconjunctivitis sicca, not specified as Sjogren's, bilateral: Secondary | ICD-10-CM | POA: Diagnosis not present

## 2020-12-07 ENCOUNTER — Encounter (HOSPITAL_COMMUNITY)
Admission: RE | Admit: 2020-12-07 | Discharge: 2020-12-07 | Disposition: A | Discharge status: Home / Self Care | Payer: PPO | Source: Ambulatory Visit | Attending: Urology | Admitting: Urology

## 2020-12-07 ENCOUNTER — Encounter (HOSPITAL_COMMUNITY): Payer: Self-pay | Admitting: Urology

## 2020-12-07 ENCOUNTER — Other Ambulatory Visit: Payer: Self-pay

## 2020-12-18 DIAGNOSIS — D3000 Benign neoplasm of unspecified kidney: Secondary | ICD-10-CM | POA: Diagnosis not present

## 2020-12-25 ENCOUNTER — Encounter (HOSPITAL_COMMUNITY)
Admission: RE | Admit: 2020-12-25 | Discharge: 2020-12-25 | Disposition: A | Discharge status: Home / Self Care | Payer: PPO | Source: Ambulatory Visit | Attending: Urology | Admitting: Urology

## 2020-12-25 ENCOUNTER — Other Ambulatory Visit: Payer: Self-pay | Admitting: Family Medicine

## 2020-12-25 ENCOUNTER — Other Ambulatory Visit: Payer: Self-pay | Admitting: Urology

## 2020-12-25 ENCOUNTER — Encounter (INDEPENDENT_AMBULATORY_CARE_PROVIDER_SITE_OTHER): Payer: Self-pay

## 2020-12-25 ENCOUNTER — Other Ambulatory Visit: Payer: Self-pay

## 2020-12-25 DIAGNOSIS — I10 Essential (primary) hypertension: Secondary | ICD-10-CM | POA: Diagnosis not present

## 2020-12-25 DIAGNOSIS — E042 Nontoxic multinodular goiter: Secondary | ICD-10-CM

## 2020-12-25 DIAGNOSIS — K5090 Crohn's disease, unspecified, without complications: Secondary | ICD-10-CM | POA: Diagnosis not present

## 2020-12-25 DIAGNOSIS — N2889 Other specified disorders of kidney and ureter: Secondary | ICD-10-CM | POA: Insufficient documentation

## 2020-12-25 DIAGNOSIS — E039 Hypothyroidism, unspecified: Secondary | ICD-10-CM | POA: Diagnosis not present

## 2020-12-25 DIAGNOSIS — Z20822 Contact with and (suspected) exposure to covid-19: Secondary | ICD-10-CM | POA: Diagnosis not present

## 2020-12-25 DIAGNOSIS — Z01818 Encounter for other preprocedural examination: Secondary | ICD-10-CM | POA: Diagnosis not present

## 2020-12-25 LAB — COMPREHENSIVE METABOLIC PANEL
ALT: 19 U/L (ref 0–44)
AST: 21 U/L (ref 15–41)
Albumin: 4.1 g/dL (ref 3.5–5.0)
Alkaline Phosphatase: 62 U/L (ref 38–126)
Anion gap: 8 (ref 5–15)
BUN: 14 mg/dL (ref 6–20)
CO2: 28 mmol/L (ref 22–32)
Calcium: 9.6 mg/dL (ref 8.9–10.3)
Chloride: 102 mmol/L (ref 98–111)
Creatinine, Ser: 0.52 mg/dL (ref 0.44–1.00)
GFR, Estimated: >60 mL/min (ref >60)
Glucose, Bld: 102 mg/dL — ABNORMAL HIGH (ref 70–99)
Potassium: 4.1 mmol/L (ref 3.5–5.1)
Sodium: 138 mmol/L (ref 135–145)
Total Bilirubin: 1.3 mg/dL — ABNORMAL HIGH (ref 0.3–1.2)
Total Protein: 7.3 g/dL (ref 6.5–8.1)

## 2020-12-25 LAB — CBC
HCT: 42.1 % (ref 36.0–46.0)
Hemoglobin: 13.8 g/dL (ref 12.0–15.0)
MCH: 29.3 pg (ref 26.0–34.0)
MCHC: 32.8 g/dL (ref 30.0–36.0)
MCV: 89.4 fL (ref 80.0–100.0)
Platelets: 299 10*3/uL (ref 150–400)
RBC: 4.71 MIL/uL (ref 3.87–5.11)
RDW: 13.2 % (ref 11.5–15.5)
WBC: 6.2 10*3/uL (ref 4.0–10.5)
nRBC: 0 % (ref 0.0–0.2)

## 2020-12-26 LAB — SARS CORONAVIRUS 2 (TAT 6-24 HRS): SARS Coronavirus 2: NEGATIVE

## 2020-12-26 LAB — URINE CULTURE: Culture: NO GROWTH

## 2020-12-26 NOTE — Progress Notes (Signed)
EKG done 12/25/20- final.

## 2020-12-28 ENCOUNTER — Ambulatory Visit (HOSPITAL_COMMUNITY): Payer: PPO | Admitting: Physician Assistant

## 2020-12-28 ENCOUNTER — Encounter (HOSPITAL_COMMUNITY): Payer: Self-pay | Admitting: Urology

## 2020-12-28 ENCOUNTER — Ambulatory Visit (HOSPITAL_COMMUNITY)
Admission: RE | Admit: 2020-12-28 | Discharge: 2020-12-29 | Disposition: A | Discharge status: Home / Self Care | Payer: PPO | Attending: Urology | Admitting: Urology

## 2020-12-28 ENCOUNTER — Other Ambulatory Visit: Payer: Self-pay

## 2020-12-28 ENCOUNTER — Ambulatory Visit (HOSPITAL_COMMUNITY): Payer: PPO | Admitting: Anesthesiology

## 2020-12-28 ENCOUNTER — Encounter (HOSPITAL_COMMUNITY)
Admission: RE | Disposition: A | Discharge status: Home / Self Care | Payer: Self-pay | Source: Home / Self Care | Attending: Urology

## 2020-12-28 DIAGNOSIS — K5090 Crohn's disease, unspecified, without complications: Secondary | ICD-10-CM | POA: Insufficient documentation

## 2020-12-28 DIAGNOSIS — R112 Nausea with vomiting, unspecified: Secondary | ICD-10-CM | POA: Insufficient documentation

## 2020-12-28 DIAGNOSIS — F418 Other specified anxiety disorders: Secondary | ICD-10-CM | POA: Diagnosis not present

## 2020-12-28 DIAGNOSIS — D3001 Benign neoplasm of right kidney: Secondary | ICD-10-CM | POA: Diagnosis not present

## 2020-12-28 DIAGNOSIS — G4730 Sleep apnea, unspecified: Secondary | ICD-10-CM | POA: Diagnosis not present

## 2020-12-28 DIAGNOSIS — I10 Essential (primary) hypertension: Secondary | ICD-10-CM | POA: Insufficient documentation

## 2020-12-28 DIAGNOSIS — Z9889 Other specified postprocedural states: Secondary | ICD-10-CM | POA: Diagnosis not present

## 2020-12-28 DIAGNOSIS — Z79899 Other long term (current) drug therapy: Secondary | ICD-10-CM | POA: Insufficient documentation

## 2020-12-28 DIAGNOSIS — D1771 Benign lipomatous neoplasm of kidney: Secondary | ICD-10-CM | POA: Insufficient documentation

## 2020-12-28 DIAGNOSIS — Z9049 Acquired absence of other specified parts of digestive tract: Secondary | ICD-10-CM | POA: Insufficient documentation

## 2020-12-28 DIAGNOSIS — Z87891 Personal history of nicotine dependence: Secondary | ICD-10-CM | POA: Insufficient documentation

## 2020-12-28 DIAGNOSIS — N2889 Other specified disorders of kidney and ureter: Secondary | ICD-10-CM | POA: Diagnosis not present

## 2020-12-28 DIAGNOSIS — E039 Hypothyroidism, unspecified: Secondary | ICD-10-CM | POA: Diagnosis not present

## 2020-12-28 HISTORY — PX: ROBOTIC ASSITED PARTIAL NEPHRECTOMY: SHX6087

## 2020-12-28 HISTORY — DX: Unspecified osteoarthritis, unspecified site: M19.90

## 2020-12-28 LAB — ABO/RH: ABO/RH(D): A POS

## 2020-12-28 LAB — BASIC METABOLIC PANEL
Anion gap: 13 (ref 5–15)
BUN: 10 mg/dL (ref 6–20)
CO2: 22 mmol/L (ref 22–32)
Calcium: 8.4 mg/dL — ABNORMAL LOW (ref 8.9–10.3)
Chloride: 103 mmol/L (ref 98–111)
Creatinine, Ser: 0.9 mg/dL (ref 0.44–1.00)
GFR, Estimated: >60 mL/min (ref >60)
Glucose, Bld: 160 mg/dL — ABNORMAL HIGH (ref 70–99)
Potassium: 3.1 mmol/L — ABNORMAL LOW (ref 3.5–5.1)
Sodium: 138 mmol/L (ref 135–145)

## 2020-12-28 LAB — CBC
HCT: 38.6 % (ref 36.0–46.0)
Hemoglobin: 12.3 g/dL (ref 12.0–15.0)
MCH: 29 pg (ref 26.0–34.0)
MCHC: 31.9 g/dL (ref 30.0–36.0)
MCV: 91 fL (ref 80.0–100.0)
Platelets: 149 10*3/uL — ABNORMAL LOW (ref 150–400)
RBC: 4.24 MIL/uL (ref 3.87–5.11)
RDW: 13 % (ref 11.5–15.5)
WBC: 16.9 10*3/uL — ABNORMAL HIGH (ref 4.0–10.5)
nRBC: 0 % (ref 0.0–0.2)

## 2020-12-28 LAB — TYPE AND SCREEN
ABO/RH(D): A POS
Antibody Screen: NEGATIVE

## 2020-12-28 SURGERY — NEPHRECTOMY, PARTIAL, ROBOT-ASSISTED
Anesthesia: General | Site: Renal | Laterality: Right

## 2020-12-28 MED ORDER — SODIUM CHLORIDE 0.45 % IV SOLN: INTRAVENOUS | Status: DC

## 2020-12-28 MED ORDER — CEFAZOLIN SODIUM-DEXTROSE 2-4 GM/100ML-% IV SOLN
INTRAVENOUS | Status: AC
  Filled 2020-12-28: qty 100

## 2020-12-28 MED ORDER — BUPIVACAINE-EPINEPHRINE 0.5% -1:200000 IJ SOLN
INTRAMUSCULAR | Status: DC | PRN
  Administered 2020-12-28: 20 mL
  Administered 2020-12-28: 10 mL

## 2020-12-28 MED ORDER — AMISULPRIDE (ANTIEMETIC) 5 MG/2ML IV SOLN: 10.0000 mg | Freq: Once | INTRAVENOUS | Status: DC | PRN

## 2020-12-28 MED ORDER — SODIUM CHLORIDE (PF) 0.9 % IJ SOLN
INTRAMUSCULAR | Status: AC
  Filled 2020-12-28: qty 20

## 2020-12-28 MED ORDER — MIDAZOLAM HCL 2 MG/2ML IJ SOLN
INTRAMUSCULAR | Status: AC
  Filled 2020-12-28: qty 2

## 2020-12-28 MED ORDER — ALBUMIN HUMAN 5 % IV SOLN
INTRAVENOUS | Status: AC
  Filled 2020-12-28: qty 250

## 2020-12-28 MED ORDER — ONDANSETRON HCL 4 MG/2ML IJ SOLN
INTRAMUSCULAR | Status: DC | PRN
  Administered 2020-12-28: 4 mg via INTRAVENOUS

## 2020-12-28 MED ORDER — LINACLOTIDE 145 MCG PO CAPS
145.0000 ug | ORAL_CAPSULE | Freq: Every day | ORAL | Status: DC
  Administered 2020-12-29: 145 ug via ORAL
  Filled 2020-12-28: qty 1

## 2020-12-28 MED ORDER — ALBUMIN HUMAN 5 % IV SOLN: INTRAVENOUS | Status: DC | PRN

## 2020-12-28 MED ORDER — BUPIVACAINE LIPOSOME 1.3 % IJ SUSP
INTRAMUSCULAR | Status: AC
  Filled 2020-12-28: qty 20

## 2020-12-28 MED ORDER — PROPOFOL 10 MG/ML IV BOLUS
INTRAVENOUS | Status: AC
  Filled 2020-12-28: qty 20

## 2020-12-28 MED ORDER — STERILE WATER FOR IRRIGATION IR SOLN
Status: DC | PRN
  Administered 2020-12-28: 1000 mL

## 2020-12-28 MED ORDER — SENNOSIDES-DOCUSATE SODIUM 8.6-50 MG PO TABS
2.0000 | ORAL_TABLET | Freq: Every day | ORAL | Status: DC
  Filled 2020-12-28: qty 2

## 2020-12-28 MED ORDER — FENTANYL CITRATE PF 50 MCG/ML IJ SOSY: 25.0000 ug | PREFILLED_SYRINGE | INTRAMUSCULAR | Status: DC | PRN

## 2020-12-28 MED ORDER — LEVOTHYROXINE SODIUM 25 MCG PO TABS
25.0000 ug | ORAL_TABLET | Freq: Every day | ORAL | Status: DC
  Administered 2020-12-29: 25 ug via ORAL
  Filled 2020-12-28: qty 1

## 2020-12-28 MED ORDER — MIDAZOLAM HCL 2 MG/2ML IJ SOLN
INTRAMUSCULAR | Status: DC | PRN
  Administered 2020-12-28: 2 mg via INTRAVENOUS

## 2020-12-28 MED ORDER — KETAMINE HCL 10 MG/ML IJ SOLN
INTRAMUSCULAR | Status: AC
  Filled 2020-12-28: qty 1

## 2020-12-28 MED ORDER — BUPIVACAINE-EPINEPHRINE (PF) 0.5% -1:200000 IJ SOLN
INTRAMUSCULAR | Status: AC
  Filled 2020-12-28: qty 30

## 2020-12-28 MED ORDER — TRAMADOL HCL 50 MG PO TABS: 50.0000 mg | ORAL_TABLET | Freq: Four times a day (QID) | ORAL | 0 refills | Status: DC | PRN

## 2020-12-28 MED ORDER — CEFAZOLIN SODIUM-DEXTROSE 1-4 GM/50ML-% IV SOLN
1.0000 g | Freq: Three times a day (TID) | INTRAVENOUS | Status: AC
  Administered 2020-12-28 – 2020-12-29 (×3): 1 g via INTRAVENOUS
  Filled 2020-12-28 (×3): qty 50

## 2020-12-28 MED ORDER — LACTATED RINGERS IV SOLN: INTRAVENOUS | Status: DC

## 2020-12-28 MED ORDER — ONDANSETRON HCL 4 MG/2ML IJ SOLN
4.0000 mg | Freq: Once | INTRAMUSCULAR | Status: AC
  Administered 2020-12-28: 4 mg via INTRAVENOUS

## 2020-12-28 MED ORDER — ACETAMINOPHEN 10 MG/ML IV SOLN
INTRAVENOUS | Status: AC
  Filled 2020-12-28: qty 100

## 2020-12-28 MED ORDER — BUPIVACAINE LIPOSOME 1.3 % IJ SUSP
INTRAMUSCULAR | Status: DC | PRN
  Administered 2020-12-28: 20 mL

## 2020-12-28 MED ORDER — ACETAMINOPHEN 500 MG PO TABS: 1000.0000 mg | ORAL_TABLET | Freq: Once | ORAL | Status: DC

## 2020-12-28 MED ORDER — PROPOFOL 10 MG/ML IV BOLUS
INTRAVENOUS | Status: DC | PRN
  Administered 2020-12-28: 130 mg via INTRAVENOUS

## 2020-12-28 MED ORDER — ACETAMINOPHEN 10 MG/ML IV SOLN
1000.0000 mg | Freq: Four times a day (QID) | INTRAVENOUS | Status: AC
  Administered 2020-12-28 – 2020-12-29 (×4): 1000 mg via INTRAVENOUS
  Filled 2020-12-28 (×4): qty 100

## 2020-12-28 MED ORDER — OXYCODONE HCL 5 MG PO TABS
5.0000 mg | ORAL_TABLET | ORAL | Status: DC | PRN
  Administered 2020-12-28 – 2020-12-29 (×4): 5 mg via ORAL
  Filled 2020-12-28 (×4): qty 1

## 2020-12-28 MED ORDER — LIDOCAINE 2% (20 MG/ML) 5 ML SYRINGE
INTRAMUSCULAR | Status: DC | PRN
  Administered 2020-12-28: 60 mg via INTRAVENOUS

## 2020-12-28 MED ORDER — SURGIFLO WITH THROMBIN (HEMOSTATIC MATRIX KIT) OPTIME
TOPICAL | Status: DC | PRN
  Administered 2020-12-28: 1 via TOPICAL

## 2020-12-28 MED ORDER — KETAMINE HCL 10 MG/ML IJ SOLN
INTRAMUSCULAR | Status: DC | PRN
  Administered 2020-12-28: 30 mg via INTRAVENOUS

## 2020-12-28 MED ORDER — FENTANYL CITRATE (PF) 250 MCG/5ML IJ SOLN
INTRAMUSCULAR | Status: DC | PRN
  Administered 2020-12-28 (×2): 50 ug via INTRAVENOUS
  Administered 2020-12-28: 100 ug via INTRAVENOUS
  Administered 2020-12-28: 50 ug via INTRAVENOUS
  Administered 2020-12-28: 100 ug via INTRAVENOUS
  Administered 2020-12-28: 50 ug via INTRAVENOUS

## 2020-12-28 MED ORDER — LIDOCAINE HCL (PF) 2 % IJ SOLN
INTRAMUSCULAR | Status: AC
  Filled 2020-12-28: qty 5

## 2020-12-28 MED ORDER — SUGAMMADEX SODIUM 200 MG/2ML IV SOLN
INTRAVENOUS | Status: DC | PRN
  Administered 2020-12-28: 200 mg via INTRAVENOUS

## 2020-12-28 MED ORDER — ROCURONIUM BROMIDE 10 MG/ML (PF) SYRINGE
PREFILLED_SYRINGE | INTRAVENOUS | Status: DC | PRN
  Administered 2020-12-28: 60 mg via INTRAVENOUS
  Administered 2020-12-28 (×2): 20 mg via INTRAVENOUS

## 2020-12-28 MED ORDER — FOLIC ACID 1 MG PO TABS
1.0000 mg | ORAL_TABLET | Freq: Every day | ORAL | Status: DC
  Administered 2020-12-29: 1 mg via ORAL
  Filled 2020-12-28: qty 1

## 2020-12-28 MED ORDER — CEFAZOLIN SODIUM-DEXTROSE 2-4 GM/100ML-% IV SOLN
2.0000 g | INTRAVENOUS | Status: AC
  Administered 2020-12-28 (×2): 2 g via INTRAVENOUS
  Filled 2020-12-28: qty 100

## 2020-12-28 MED ORDER — ESCITALOPRAM OXALATE 10 MG PO TABS
10.0000 mg | ORAL_TABLET | Freq: Every day | ORAL | Status: DC
  Administered 2020-12-29: 10 mg via ORAL
  Filled 2020-12-28: qty 1

## 2020-12-28 MED ORDER — DEXAMETHASONE SODIUM PHOSPHATE 10 MG/ML IJ SOLN
INTRAMUSCULAR | Status: DC | PRN
  Administered 2020-12-28: 5 mg via INTRAVENOUS

## 2020-12-28 MED ORDER — ACETAMINOPHEN 10 MG/ML IV SOLN
INTRAVENOUS | Status: DC | PRN
  Administered 2020-12-28: 1000 mg via INTRAVENOUS

## 2020-12-28 MED ORDER — ONDANSETRON HCL 4 MG/2ML IJ SOLN
4.0000 mg | Freq: Three times a day (TID) | INTRAMUSCULAR | Status: DC | PRN
  Administered 2020-12-28: 4 mg via INTRAVENOUS
  Filled 2020-12-28 (×2): qty 2

## 2020-12-28 MED ORDER — CHLORHEXIDINE GLUCONATE CLOTH 2 % EX PADS: 6.0000 | MEDICATED_PAD | Freq: Every day | CUTANEOUS | Status: DC

## 2020-12-28 MED ORDER — FENTANYL CITRATE (PF) 250 MCG/5ML IJ SOLN
INTRAMUSCULAR | Status: AC
  Filled 2020-12-28: qty 5

## 2020-12-28 MED ORDER — LACTATED RINGERS IR SOLN
Status: DC | PRN
  Administered 2020-12-28: 3000 mL

## 2020-12-28 MED ORDER — ATORVASTATIN CALCIUM 10 MG PO TABS
10.0000 mg | ORAL_TABLET | Freq: Every day | ORAL | Status: DC
  Administered 2020-12-29: 10 mg via ORAL
  Filled 2020-12-28: qty 1

## 2020-12-28 MED ORDER — SODIUM CHLORIDE (PF) 0.9 % IJ SOLN
INTRAMUSCULAR | Status: AC
  Filled 2020-12-28: qty 10

## 2020-12-28 MED ORDER — ROCURONIUM BROMIDE 10 MG/ML (PF) SYRINGE
PREFILLED_SYRINGE | INTRAVENOUS | Status: AC
  Filled 2020-12-28: qty 10

## 2020-12-28 MED ORDER — BUSPIRONE HCL 5 MG PO TABS
5.0000 mg | ORAL_TABLET | Freq: Two times a day (BID) | ORAL | Status: DC
  Administered 2020-12-28 – 2020-12-29 (×2): 5 mg via ORAL
  Filled 2020-12-28 (×3): qty 1

## 2020-12-28 SURGICAL SUPPLY — 76 items
APPLICATOR ARISTA FLEXITIP XL (MISCELLANEOUS) IMPLANT
APPLICATOR SURGIFLO ENDO (HEMOSTASIS) ×2 IMPLANT
BAG COUNTER SPONGE SURGICOUNT (BAG) IMPLANT
CHLORAPREP W/TINT 26 (MISCELLANEOUS) ×2 IMPLANT
CLIP LIGATING HEM O LOK PURPLE (MISCELLANEOUS) ×2 IMPLANT
CLIP LIGATING HEMO LOK XL GOLD (MISCELLANEOUS) IMPLANT
CLIP LIGATING HEMO O LOK GREEN (MISCELLANEOUS) ×6 IMPLANT
COVER SURGICAL LIGHT HANDLE (MISCELLANEOUS) ×2 IMPLANT
COVER TIP SHEARS 8 DVNC (MISCELLANEOUS) ×1 IMPLANT
COVER TIP SHEARS 8MM DA VINCI (MISCELLANEOUS) ×1
CUTTER ECHEON FLEX ENDO 45 340 (ENDOMECHANICALS) IMPLANT
DECANTER SPIKE VIAL GLASS SM (MISCELLANEOUS) ×2 IMPLANT
DERMABOND ADVANCED (GAUZE/BANDAGES/DRESSINGS) ×1
DERMABOND ADVANCED .7 DNX12 (GAUZE/BANDAGES/DRESSINGS) ×1 IMPLANT
DRAIN CHANNEL 15F RND FF 3/16 (WOUND CARE) ×2 IMPLANT
DRAPE ARM DVNC X/XI (DISPOSABLE) ×4 IMPLANT
DRAPE COLUMN DVNC XI (DISPOSABLE) ×1 IMPLANT
DRAPE DA VINCI XI ARM (DISPOSABLE) ×4
DRAPE DA VINCI XI COLUMN (DISPOSABLE) ×1
DRAPE INCISE IOBAN 66X45 STRL (DRAPES) ×2 IMPLANT
DRAPE SHEET LG 3/4 BI-LAMINATE (DRAPES) ×2 IMPLANT
DRSG TEGADERM 4X4.75 (GAUZE/BANDAGES/DRESSINGS) ×2 IMPLANT
ELECT PENCIL ROCKER SW 15FT (MISCELLANEOUS) ×2 IMPLANT
ELECT REM PT RETURN 15FT ADLT (MISCELLANEOUS) ×2 IMPLANT
EVACUATOR SILICONE 100CC (DRAIN) ×2 IMPLANT
GAUZE SPONGE 4X4 12PLY STRL (GAUZE/BANDAGES/DRESSINGS) ×2 IMPLANT
GLOVE SURG ENC MOIS LTX SZ6.5 (GLOVE) IMPLANT
GLOVE SURG ENC MOIS LTX SZ7 (GLOVE) ×4 IMPLANT
GLOVE SURG ENC TEXT LTX SZ7.5 (GLOVE) ×4 IMPLANT
GLOVE SURG POLY ORTHO LF SZ7.5 (GLOVE) ×4 IMPLANT
GLOVE SURG POLYISO LF SZ6.5 (GLOVE) ×2 IMPLANT
GLOVE SURG UNDER POLY LF SZ6.5 (GLOVE) ×2 IMPLANT
GLOVE SURG UNDER POLY LF SZ7.5 (GLOVE) ×4 IMPLANT
GOWN STRL REUS W/TWL LRG LVL3 (GOWN DISPOSABLE) ×8 IMPLANT
GOWN STRL REUS W/TWL XL LVL3 (GOWN DISPOSABLE) ×4 IMPLANT
HEMOSTAT ARISTA ABSORB 3G PWDR (HEMOSTASIS) IMPLANT
HOLDER FOLEY CATH W/STRAP (MISCELLANEOUS) ×2 IMPLANT
IRRIG SUCT STRYKERFLOW 2 WTIP (MISCELLANEOUS) ×2
IRRIGATION SUCT STRKRFLW 2 WTP (MISCELLANEOUS) ×1 IMPLANT
IV LACTATED RINGER IRRG 3000ML (IV SOLUTION) ×1
IV LR IRRIG 3000ML ARTHROMATIC (IV SOLUTION) ×1 IMPLANT
KIT BASIN OR (CUSTOM PROCEDURE TRAY) ×2 IMPLANT
KIT TURNOVER KIT A (KITS) IMPLANT
LOOP VESSEL MAXI BLUE (MISCELLANEOUS) ×2 IMPLANT
MARKER SKIN DUAL TIP RULER LAB (MISCELLANEOUS) ×2 IMPLANT
NEEDLE INSUFFLATION 14GA 120MM (NEEDLE) IMPLANT
NS IRRIG 1000ML POUR BTL (IV SOLUTION) IMPLANT
PAD POSITIONING PINK XL (MISCELLANEOUS) ×2 IMPLANT
POUCH SPECIMEN RETRIEVAL 10MM (ENDOMECHANICALS) IMPLANT
PROTECTOR NERVE ULNAR (MISCELLANEOUS) ×4 IMPLANT
SCISSORS LAP 5X45 EPIX DISP (ENDOMECHANICALS) ×2 IMPLANT
SEAL CANN UNIV 5-8 DVNC XI (MISCELLANEOUS) ×4 IMPLANT
SEAL XI 5MM-8MM UNIVERSAL (MISCELLANEOUS) ×4
SET TUBE SMOKE EVAC HIGH FLOW (TUBING) ×2 IMPLANT
SOLUTION ELECTROLUBE (MISCELLANEOUS) ×2 IMPLANT
STAPLE RELOAD 45 WHT (STAPLE) IMPLANT
STAPLE RELOAD 45MM WHITE (STAPLE)
STRIP CLOSURE SKIN 1/2X4 (GAUZE/BANDAGES/DRESSINGS) ×2 IMPLANT
SURGIFLO W/THROMBIN 8M KIT (HEMOSTASIS) ×2 IMPLANT
SUT ETHILON 3 0 PS 1 (SUTURE) ×2 IMPLANT
SUT MNCRL AB 4-0 PS2 18 (SUTURE) ×4 IMPLANT
SUT V-LOC BARB 180 2/0GR6 GS22 (SUTURE) ×2
SUT VIC AB 0 CT1 27 (SUTURE) ×1
SUT VIC AB 0 CT1 27XBRD ANTBC (SUTURE) ×1 IMPLANT
SUT VICRYL 0 UR6 27IN ABS (SUTURE) IMPLANT
SUT VLOC BARB 180 ABS3/0GR12 (SUTURE) ×2
SUTURE V-LC BRB 180 2/0GR6GS22 (SUTURE) ×1 IMPLANT
SUTURE VLOC BRB 180 ABS3/0GR12 (SUTURE) ×1 IMPLANT
TOWEL OR 17X26 10 PK STRL BLUE (TOWEL DISPOSABLE) ×2 IMPLANT
TOWEL OR NON WOVEN STRL DISP B (DISPOSABLE) IMPLANT
TRAY FOLEY MTR SLVR 16FR STAT (SET/KITS/TRAYS/PACK) IMPLANT
TRAY LAPAROSCOPIC (CUSTOM PROCEDURE TRAY) ×2 IMPLANT
TROCAR BLADELESS OPT 5 100 (ENDOMECHANICALS) ×2 IMPLANT
TROCAR ENDOPATH XCEL 12X100 BL (ENDOMECHANICALS) IMPLANT
TROCAR XCEL 12X100 BLDLESS (ENDOMECHANICALS) ×2 IMPLANT
WATER STERILE IRR 1000ML POUR (IV SOLUTION) ×2 IMPLANT

## 2020-12-28 NOTE — Anesthesia Preprocedure Evaluation (Signed)
Anesthesia Evaluation  Patient identified by MRN, date of birth, ID band Patient awake    Reviewed: Allergy & Precautions, NPO status , Patient's Chart, lab work & pertinent test results  Airway Mallampati: II  TM Distance: >3 FB Neck ROM: Full    Dental  (+) Dental Advisory Given   Pulmonary sleep apnea , former smoker,    breath sounds clear to auscultation       Cardiovascular hypertension, Pt. on medications  Rhythm:Regular Rate:Normal     Neuro/Psych negative neurological ROS     GI/Hepatic negative GI ROS, Neg liver ROS,   Endo/Other  Hypothyroidism   Renal/GU negative Renal ROS     Musculoskeletal  (+) Arthritis ,   Abdominal   Peds  Hematology negative hematology ROS (+)   Anesthesia Other Findings   Reproductive/Obstetrics                             Anesthesia Physical Anesthesia Plan  ASA: 2  Anesthesia Plan: General   Post-op Pain Management:    Induction: Intravenous  PONV Risk Score and Plan: 3 and Dexamethasone, Treatment may vary due to age or medical condition, Midazolam and Ondansetron  Airway Management Planned: Oral ETT  Additional Equipment:   Intra-op Plan:   Post-operative Plan: Extubation in OR  Informed Consent: I have reviewed the patients History and Physical, chart, labs and discussed the procedure including the risks, benefits and alternatives for the proposed anesthesia with the patient or authorized representative who has indicated his/her understanding and acceptance.     Dental advisory given  Plan Discussed with: CRNA  Anesthesia Plan Comments:         Anesthesia Quick Evaluation

## 2020-12-28 NOTE — Interval H&P Note (Signed)
History and Physical Interval Note:  12/28/2020 7:09 AM  Rhonda Wilkerson  has presented today for surgery, with the diagnosis of RIGHT RENAL MASS.  The various methods of treatment have been discussed with the patient and family. After consideration of risks, benefits and other options for treatment, the patient has consented to  Procedure(s): XI ROBOTIC ASSITED RIGHT PARTIAL NEPHRECTOMY (Right) as a surgical intervention.  The patient's history has been reviewed, patient examined, no change in status, stable for surgery.  I have reviewed the patient's chart and labs.  Questions were answered to the patient's satisfaction.     Ardis Hughs

## 2020-12-28 NOTE — Plan of Care (Signed)
  Problem: Bowel/Gastric: Goal: Gastrointestinal status for postoperative course will improve Outcome: Not Progressing

## 2020-12-28 NOTE — Discharge Instructions (Signed)

## 2020-12-28 NOTE — Op Note (Signed)
Preoperative diagnosis:  right renal mass   Postoperative diagnosis:  same   Procedure: Robotic assisted laparoscopic right partial nephrectomy  Surgeon: Ardis Hughs, MD 1st assistant: Aldine Contes, MD [facility] resident)  Anesthesia: General  Complications: None  Intraoperative findings:  #1.  3 arteries, 1 posterior lower pole artery appeared to be feeding directly into the AML, which we ligated. #2. Warm ischemia time 15 minutes  EBL: 100 cc  Specimens: right renal mass   Indication:  Rhonda Wilkerson is a 55 y.o. patient with right renal mass.  After reviewing the management options for treatment, he elected to proceed with the above surgical procedure(s). We have discussed the potential benefits and risks of the procedure, side effects of the proposed treatment, the likelihood of the patient achieving the goals of the procedure, and any potential problems that might occur during the procedure or recuperation. Informed consent has been obtained.   Description of procedure:  An assistant was required for this surgical procedure.  The duties of the assistant included but were not limited to suctioning, passing suture, camera manipulation, retraction. This procedure would not be able to be performed without an Environmental consultant.  The patient was taken to the operating room and a general anesthetic was administered. The patient was given preoperative antibiotics, placed in the right modified flank position with care to pad all potential pressure points, and prepped and draped in the usual sterile fashion. Next a preoperative timeout was performed.  A site was selected on the right side of the umbilicus for placement of the camera port. This was placed using a standard modified Hassan technique with entry into the peritoneum with a 10 mm 0 deg laparoscope with a visual obturator. We entered the peritoneum without incident and established pneumoperitoneum.  The camera was then used to  inspect the abdomen and there was no evidence of any intra-abdominal injuries or other abnormalities. The remaining abdominal ports were then placed. 8 mm robotic ports were placed in the right upper quadrant, right lower quadrant, and far right lateral abdominal wall. A 12 mm port was placed in the upper midline for laparoscopic assistance. Lysis of adhesions were performed at the midline to allow placement of the 12 mm assistant port. All ports were placed under direct vision without difficulty. The surgical cart was then docked.   Utilizing the cautery scissors, the white line of Toldt was incised allowing the colon to be mobilized medially and the plane between the mesocolon and the anterior layer of Gerota's fascia to be developed and the kidney to be exposed.  The ureter and gonadal vein were identified inferiorly and the ureter was lifted anteriorly off the psoas muscle.  Dissection proceeded superiorly along the gonadal vein until the renal vein was identified.  The renal hilum was then carefully isolated with a combination of blunt and sharp dissection allowing the renal arterial and venous structures to be separated and isolated in preparation for renal hilar vessel clamping.  There was 3 renal arteries, 1 posterior artery appeared to be inserting directly into the AML, which we opted to ligate.  The other 2 renal arteries were preserved.  Attention turned to the kidney and the perinephric fat surrounding the renal mass was removed and the kidney was mobilized sufficiently for exposure and resection of the renal mass.    Once the renal mass was properly isolated, preparations were made for resection of the tumor.    The renal arteries was then clamped with a bulldog clamp.  The tumor was then excised with cold scissor dissection along with an adequate visible gross margin of normal renal parenchyma. The tumor appeared to be excised without any gross violation of the tumor.  A running 3-0 V-lock  suture was then brought through the capsule of the kidney and run along the base of the renal defect to provide hemostasis and close any entry into the renal collecting system if present. Weck clips were used to secure this suture outside the renal capsule at the proximal and distal ends. The bulldog clamps were then removed from the renal hilar vessel. A running 2-0 V lock suture was then used to close the capsule of the kidney using a sliding clip technique which resulted in excellent hemostasis. An additional hemostatic agent (Surgiflo) was then placed into the renal defect. Surgicel was then placed over the defect.    Total warm renal ischemia time was  15 minutes. The renal tumor resection site was examined. Hemostasis appeared adequate.   The kidney was placed back into its normal anatomic position and covered with perinephric fat as needed.  A # 42 Blake drain was then brought through the lateral lower port site and positioned in the perinephric space.  It was secured to the skin with a nylon suture. The surgical robotic cart was undocked.  The renal tumor specimen was removed intact within an endopouch retrieval bag via the camera port sites.  The camera port site and the other 12 mm port site were then closed at the fascial level with 0-vicryl suture.  All other laparoscopic/robotic ports were removed under direct vision and the pneumoperitoneum let down with inspection of the operative field performed and hemostasis again confirmed. All incision sites were then injected with local anesthetic and reapproximated at the skin level with 4-0 monocryl subcuticular closures. Dermabond was applied to the skin.  The patient tolerated the procedure well and without complications.  The patient was able to be extubated and transferred to the recovery unit in satisfactory condition.  Ardis Hughs, M.D.

## 2020-12-28 NOTE — H&P (Signed)
Pt presents today for pre-operative history and physical exam in anticipation of right robotic assisted lap partial nephrectomy by Dr. Louis Meckel on 12/28/20. She is doing well and is without complaint.   Pt denies F/C, HA, CP, SOB, N/V, diarrhea/constipation, back pain, flank pain, hematuria, and dysuria.    HX:     CC: Renal Mass  HPI: Rhonda Wilkerson is a 55 year-old female established patient who is here further eval and management of a renal mass.  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 She also has recently undergone 2 separate bilateral breast reductions surgeries.    The mass is on the right side.   The lesion(s) was first noted on 01/10/2019. The mass was seen on CT Scan.   She has had no symptoms. She has not seen blood in her urine. She does have a good appetite. She is not having pain in new locations. She has not recently had unwanted weight loss.   She has had previous abdominal surgery. Her past surgical history includes: Small bowel resection in 2016 for Crohn's disease. The patient can walk a flight of steps.   The patient denies history of diabetes, heart attack or stroke. There is not a a family history of kidney cancer. There is no family history of brain tumors (AMLs), seizures or brain aneurysm's.     ALLERGIES: None   MEDICATIONS: Atorvastatin Calcium 10 mg tablet  Buspirone Hcl 5 mg tablet  Escitalopram Oxalate 10 mg tablet  Folic Acid 1 mg tablet  Levothyroxine 25 mcg capsule  Linzess 145 mcg capsule  Losartan-Hydrochlorothiazide 100 mg-25 mg tablet  Potassium 99 mg capsule  Vitamin B12  Vitamin C  Vitamin D  Vitamin E  Zinc     GU PSH: None     PSH Notes: Breast reduction (12/2019), bowel resection (08/2014), right knee meniscus (11/2017), right ovary removed (10/2002), partial thyroidectomy (2006), Breast reduction (10/08/2020)   NON-GU PSH: Carpal tunnel surgery, 08/2011     GU PMH:  Benign tumor right kidney - 10/09/2020      PMH Notes: Crohns   NON-GU PMH: Anxiety Arthritis Depression Hypercholesterolemia Hypertension Hypothyroidism Sleep Apnea    FAMILY HISTORY: Atrial Fibrillation - Father, Brother Breast Cancer - Runs in Family Glaucoma - Father nephrolithiasis - Brother   SOCIAL HISTORY: Marital Status: Social worker Preferred Language: English; Ethnicity: Not Hispanic Or Latino; Race: White Current Smoking Status: Patient does not smoke anymore.   Tobacco Use Assessment Completed: Used Tobacco in last 30 days? Does not use smokeless tobacco. Social Drinker.  Does not use drugs. Drinks 2 caffeinated drinks per day. Has not had a blood transfusion.     Notes: quit smoking 10 years ago 1/2 pack per week  ETOH beer twice per week    REVIEW OF SYSTEMS:    GU Review Female:   Patient denies frequent urination, hard to postpone urination, burning /pain with urination, get up at night to urinate, leakage of urine, stream starts and stops, trouble starting your stream, have to strain to urinate, and being pregnant.  Gastrointestinal (Upper):   Patient denies nausea, vomiting, and indigestion/ heartburn.  Gastrointestinal (Lower):   Patient denies diarrhea and constipation.  Constitutional:   Patient denies weight loss, fever, night sweats, and fatigue.  Skin:   Patient denies skin rash/ lesion and itching.  Eyes:   Patient denies blurred vision and double vision.  Ears/ Nose/ Throat:  Patient denies sore throat and sinus problems.  Hematologic/Lymphatic:   Patient denies swollen glands and easy bruising.  Cardiovascular:   Patient denies leg swelling and chest pains.  Respiratory:   Patient denies cough and shortness of breath.  Endocrine:   Patient denies excessive thirst.  Musculoskeletal:   Patient denies back pain and joint pain.  Neurological:   Patient denies headaches and dizziness.  Psychologic:   Patient denies depression and anxiety.    VITAL SIGNS:      12/18/2020 12:52 PM  Weight 162 lb / 73.48 kg  Height 65 in / 165.1 cm  BP 124/81 mmHg  Pulse 74 /min  Temperature 98.0 F / 36.6 C  BMI 27.0 kg/m   MULTI-SYSTEM PHYSICAL EXAMINATION:    Constitutional: Well-nourished. No physical deformities. Normally developed. Good grooming.  Neck: Neck symmetrical, not swollen. Normal tracheal position.  Respiratory: Normal breath sounds. No labored breathing, no use of accessory muscles.   Cardiovascular: Regular rate and rhythm. No murmur, no gallop.   Lymphatic: No enlargement of neck, axillae, groin.  Skin: No paleness, no jaundice, no cyanosis. No lesion, no ulcer, no rash.  Neurologic / Psychiatric: Oriented to time, oriented to place, oriented to person. No depression, no anxiety, no agitation.  Gastrointestinal: No mass, no tenderness, no rigidity, non obese abdomen.  Eyes: Normal conjunctivae. Normal eyelids.  Ears, Nose, Mouth, and Throat: Left ear no scars, no lesions, no masses. Right ear no scars, no lesions, no masses. Nose no scars, no lesions, no masses. Normal hearing. Normal lips.  Musculoskeletal: Normal gait and station of head and neck.     Complexity of Data:  Records Review:   Previous Patient Records  Urine Test Review:   Urinalysis   12/18/20  Urinalysis  Urine Appearance Clear   Urine Color Yellow   Urine Glucose Neg mg/dL  Urine Bilirubin Neg mg/dL  Urine Ketones Neg mg/dL  Urine Specific Gravity 1.025   Urine Blood Neg ery/uL  Urine pH 5.5   Urine Protein Neg mg/dL  Urine Urobilinogen 0.2 mg/dL  Urine Nitrites Neg   Urine Leukocyte Esterase Neg leu/uL   PROCEDURES:          Urinalysis - 81003 Dipstick Dipstick Cont'd  Color: Yellow Bilirubin: Neg mg/dL  Appearance: Clear Ketones: Neg mg/dL  Specific Gravity: 1.025 Blood: Neg ery/uL  pH: 5.5 Protein: Neg mg/dL  Glucose: Neg mg/dL Urobilinogen: 0.2 mg/dL    Nitrites: Neg    Leukocyte Esterase: Neg leu/uL    ASSESSMENT:       ICD-10 Details  1 GU:   Benign Neo Kidney, Unspec - D30.00    PLAN:           Schedule Return Visit/Planned Activity: Keep Scheduled Appointment - Schedule Surgery          Document Letter(s):  Created for Patient: Clinical Summary         Notes:   There are no changes in the patients history or physical exam since last evaluation by Dr. Louis Meckel. Pt is scheduled to undergo right RAL partial nephrectomy on 12/28/20.   All pt's questions were answered to the best of my ability.

## 2020-12-28 NOTE — Anesthesia Procedure Notes (Signed)
Procedure Name: Intubation Date/Time: 12/28/2020 7:22 AM Performed by: Lollie Sails, CRNA Pre-anesthesia Checklist: Patient identified, Emergency Drugs available, Suction available, Patient being monitored and Timeout performed Patient Re-evaluated:Patient Re-evaluated prior to induction Oxygen Delivery Method: Circle system utilized Preoxygenation: Pre-oxygenation with 100% oxygen Induction Type: IV induction Ventilation: Mask ventilation without difficulty Laryngoscope Size: Miller and 3 Grade View: Grade I Tube type: Oral Tube size: 7.5 mm Number of attempts: 1 Airway Equipment and Method: Stylet Placement Confirmation: ETT inserted through vocal cords under direct vision, positive ETCO2 and breath sounds checked- equal and bilateral Secured at: 23 cm Tube secured with: Tape Dental Injury: Teeth and Oropharynx as per pre-operative assessment

## 2020-12-28 NOTE — Transfer of Care (Signed)
Immediate Anesthesia Transfer of Care Note  Patient: Rhonda Wilkerson  Procedure(s) Performed: XI ROBOTIC ASSITED RIGHT PARTIAL NEPHRECTOMY (Right: Renal)  Patient Location: PACU  Anesthesia Type:General  Level of Consciousness: awake, sedated and responds to stimulation  Airway & Oxygen Therapy: Patient Spontanous Breathing and Patient connected to face mask oxygen  Post-op Assessment: Report given to RN and Post -op Vital signs reviewed and stable  Post vital signs: Reviewed and stable  Last Vitals:  Vitals Value Taken Time  BP 153/84 12/28/20 1141  Temp    Pulse 89 12/28/20 1144  Resp 18 12/28/20 1144  SpO2 100 % 12/28/20 1144  Vitals shown include unvalidated device data.  Last Pain:  Vitals:   12/28/20 0649  TempSrc: Oral         Complications: No notable events documented.

## 2020-12-28 NOTE — Plan of Care (Signed)
  Problem: Education: Goal: Knowledge of General Education information will improve Description: Including pain rating scale, medication(s)/side effects and non-pharmacologic comfort measures Outcome: Progressing   Problem: Health Behavior/Discharge Planning: Goal: Ability to manage health-related needs will improve Outcome: Progressing   Problem: Elimination: Goal: Will not experience complications related to urinary retention Outcome: Progressing   Problem: Pain Managment: Goal: General experience of comfort will improve Outcome: Progressing

## 2020-12-29 ENCOUNTER — Encounter (HOSPITAL_COMMUNITY): Payer: Self-pay | Admitting: Urology

## 2020-12-29 DIAGNOSIS — D1771 Benign lipomatous neoplasm of kidney: Secondary | ICD-10-CM | POA: Diagnosis not present

## 2020-12-29 DIAGNOSIS — G4733 Obstructive sleep apnea (adult) (pediatric): Secondary | ICD-10-CM | POA: Diagnosis not present

## 2020-12-29 LAB — CBC
HCT: 36.7 % (ref 36.0–46.0)
Hemoglobin: 12.2 g/dL (ref 12.0–15.0)
MCH: 29.1 pg (ref 26.0–34.0)
MCHC: 33.2 g/dL (ref 30.0–36.0)
MCV: 87.6 fL (ref 80.0–100.0)
Platelets: 262 10*3/uL (ref 150–400)
RBC: 4.19 MIL/uL (ref 3.87–5.11)
RDW: 13 % (ref 11.5–15.5)
WBC: 11.4 10*3/uL — ABNORMAL HIGH (ref 4.0–10.5)
nRBC: 0 % (ref 0.0–0.2)

## 2020-12-29 LAB — BASIC METABOLIC PANEL
Anion gap: 9 (ref 5–15)
BUN: 9 mg/dL (ref 6–20)
CO2: 25 mmol/L (ref 22–32)
Calcium: 8.3 mg/dL — ABNORMAL LOW (ref 8.9–10.3)
Chloride: 99 mmol/L (ref 98–111)
Creatinine, Ser: 0.83 mg/dL (ref 0.44–1.00)
GFR, Estimated: >60 mL/min (ref >60)
Glucose, Bld: 125 mg/dL — ABNORMAL HIGH (ref 70–99)
Potassium: 2.8 mmol/L — ABNORMAL LOW (ref 3.5–5.1)
Sodium: 133 mmol/L — ABNORMAL LOW (ref 135–145)

## 2020-12-29 LAB — CREATININE, FLUID (PLEURAL, PERITONEAL, JP DRAINAGE): Creat, Fluid: 0.9 mg/dL

## 2020-12-29 MED ORDER — OXYCODONE-ACETAMINOPHEN 5-325 MG PO TABS: 1.0000 | ORAL_TABLET | Freq: Three times a day (TID) | ORAL | 0 refills | Status: DC | PRN

## 2020-12-29 NOTE — Plan of Care (Signed)
  Problem: Clinical Measurements: Goal: Will remain free from infection Outcome: Progressing   Problem: Nutrition: Goal: Adequate nutrition will be maintained Outcome: Progressing   Problem: Coping: Goal: Level of anxiety will decrease Outcome: Progressing   Problem: Elimination: Goal: Will not experience complications related to bowel motility Outcome: Progressing Goal: Will not experience complications related to urinary retention Outcome: Progressing   Problem: Pain Managment: Goal: General experience of comfort will improve Outcome: Progressing   Problem: Bowel/Gastric: Goal: Gastrointestinal status for postoperative course will improve Outcome: Progressing

## 2020-12-29 NOTE — Plan of Care (Signed)
  Problem: Health Behavior/Discharge Planning: Goal: Ability to manage health-related needs will improve Outcome: Completed/Met   Problem: Clinical Measurements: Goal: Ability to maintain clinical measurements within normal limits will improve Outcome: Completed/Met Goal: Will remain free from infection Outcome: Completed/Met Goal: Diagnostic test results will improve Outcome: Completed/Met Goal: Respiratory complications will improve Outcome: Completed/Met Goal: Cardiovascular complication will be avoided Outcome: Completed/Met   Problem: Activity: Goal: Risk for activity intolerance will decrease Outcome: Completed/Met   Problem: Nutrition: Goal: Adequate nutrition will be maintained Outcome: Completed/Met   Problem: Coping: Goal: Level of anxiety will decrease Outcome: Completed/Met   Problem: Elimination: Goal: Will not experience complications related to bowel motility Outcome: Completed/Met Goal: Will not experience complications related to urinary retention Outcome: Completed/Met   Problem: Pain Managment: Goal: General experience of comfort will improve Outcome: Completed/Met   Problem: Safety: Goal: Ability to remain free from injury will improve Outcome: Completed/Met   Problem: Skin Integrity: Goal: Risk for impaired skin integrity will decrease Outcome: Completed/Met   Problem: Education: Goal: Knowledge of the prescribed therapeutic regimen will improve Outcome: Completed/Met   Problem: Bowel/Gastric: Goal: Gastrointestinal status for postoperative course will improve Outcome: Completed/Met   Problem: Clinical Measurements: Goal: Postoperative complications will be avoided or minimized Outcome: Completed/Met   Problem: Respiratory: Goal: Ability to achieve and maintain a regular respiratory rate will improve Outcome: Completed/Met   Problem: Skin Integrity: Goal: Demonstration of wound healing without infection will improve Outcome:  Completed/Met   Problem: Urinary Elimination: Goal: Ability to avoid or minimize complications of infection will improve Outcome: Completed/Met Goal: Ability to achieve and maintain urine output will improve Outcome: Completed/Met

## 2020-12-29 NOTE — Progress Notes (Signed)
1 Day Post-Op Subjective: Patient reports N/V yesterday--better now. Minimal pain.  Objective: Vital signs in last 24 hours: Temp:  [98.2 F (36.8 C)-98.9 F (37.2 C)] 98.6 F (37 C) (11/12 0448) Pulse Rate:  [78-95] 78 (11/12 0448) Resp:  [16-22] 18 (11/12 0448) BP: (138-160)/(72-92) 138/80 (11/12 0448) SpO2:  [94 %-100 %] 95 % (11/12 0448) Weight:  [75.3 kg] 75.3 kg (11/11 1330)  Intake/Output from previous day: 11/11 0701 - 11/12 0700 In: 5618.4 [P.O.:120; I.V.:4248.4; IV Piggyback:1250] Out: 2280 [Urine:2045; Drains:135; Blood:100] Intake/Output this shift: No intake/output data recorded.  Physical Exam:  Constitutional: Vital signs reviewed. WD WN in NAD   Eyes: PERRL, No scleral icterus.   Cardiovascular: RRR Pulmonary/Chest: Normal effort Abdominal: Soft. Appropriate tenderness. Mild tympany. Incision sites C/D/I.   Lab Results: Recent Labs    12/28/20 1200 12/29/20 0525  HGB 12.3 12.2  HCT 38.6 36.7   BMET Recent Labs    12/28/20 1200 12/29/20 0525  NA 138 133*  K 3.1* 2.8*  CL 103 99  CO2 22 25  GLUCOSE 160* 125*  BUN 10 9  CREATININE 0.90 0.83  CALCIUM 8.4* 8.3*   No results for input(s): LABPT, INR in the last 72 hours. No results for input(s): LABURIN in the last 72 hours. Results for orders placed or performed in visit on 12/25/20  SARS Coronavirus 2 (TAT 6-24 hrs)     Status: None   Collection Time: 12/25/20 12:00 AM  Result Value Ref Range Status   SARS Coronavirus 2 RESULT: NEGATIVE  Final    Comment: RESULT: NEGATIVESARS-CoV-2 INTERPRETATION:A NEGATIVE  test result means that SARS-CoV-2 RNA was not present in the specimen above the limit of detection of this test. This does not preclude a possible SARS-CoV-2 infection and should not be used as the  sole basis for patient management decisions. Negative results must be combined with clinical observations, patient history, and epidemiological information. Optimum specimen types and timing for  peak viral levels during infections caused by SARS-CoV-2  have not been determined. Collection of multiple specimens or types of specimens may be necessary to detect virus. Improper specimen collection and handling, sequence variability under primers/probes, or organism present below the limit of detection may  lead to false negative results. Positive and negative predictive values of testing are highly dependent on prevalence. False negative test results are more likely when prevalence of disease is high.The expected result is NEGATIVE.Fact S heet for  Healthcare Providers: LocalChronicle.no Sheet for Patients: SalonLookup.es Reference Range - Negative     Studies/Results: No results found.  Assessment/Plan:  POD 1 Rt partial nephr. Had rough day yesterday but seems better now. Will advance diet, d/c foley, check JP fluid Cr, ambulate.  Home later if she has a good morning.   LOS: 0 days   Jorja Loa 12/29/2020, 7:34 AM

## 2020-12-29 NOTE — Discharge Summary (Signed)
Patient ID: Rhonda Wilkerson MRN: [id number] DOB/AGE: [birth year] 55 y.o.  Admit date: 12/28/2020 Discharge date: 12/30/2020  Primary Care Physician:  Hayden Rasmussen, MD  Discharge Diagnoses: Right renal mass Present on Admission:  Renal mass    Discharge Medications: Allergies as of 12/29/2020   No Known Allergies      Medication List     TAKE these medications    atorvastatin 10 MG tablet Commonly known as: LIPITOR Take 10 mg by mouth daily.   busPIRone 5 MG tablet Commonly known as: BUSPAR Take 5 mg by mouth 2 (two) times daily.   escitalopram 10 MG tablet Commonly known as: LEXAPRO Take 10 mg by mouth daily.   folic acid 1 MG tablet Commonly known as: FOLVITE Take 1 mg by mouth daily.   levothyroxine 25 MCG tablet Commonly known as: SYNTHROID Take 25 mcg by mouth daily before breakfast.   linaclotide 145 MCG Caps capsule Commonly known as: LINZESS Take 145 mcg by mouth daily.   losartan-hydrochlorothiazide 100-25 MG tablet Commonly known as: HYZAAR Take 1 tablet by mouth daily.   oxyCODONE-acetaminophen 5-325 MG tablet Commonly known as: Percocet Take 1 tablet by mouth every 8 (eight) hours as needed for severe pain.   Potassium 99 MG Tabs Take 99 mg by mouth daily.   Stelara 90 MG/ML Sosy injection Generic drug: ustekinumab Inject 90 mg into the skin every 8 (eight) weeks.   traMADol 50 MG tablet Commonly known as: Ultram Take 1-2 tablets (50-100 mg total) by mouth every 6 (six) hours as needed for moderate pain.   valACYclovir 1000 MG tablet Commonly known as: VALTREX Take 1,000 mg by mouth daily as needed (outbreak).   vitamin B-12 1000 MCG tablet Commonly known as: CYANOCOBALAMIN Take 1,000 mcg by mouth daily.   vitamin C 1000 MG tablet Take 1,000 mg by mouth daily.   Vitamin D3 125 MCG (5000 UT) Caps Take 5,000 Units by mouth daily.   vitamin E 180 MG (400 UNITS) capsule Take 400 Units by mouth daily.   zinc gluconate 50 MG  tablet Take 50 mg by mouth daily.         Significant Diagnostic Studies:  No results found.  Brief H and P: For complete details please refer to admission H and P, but in brief patient is admitted for laparoscopic management of a right renal mass.  Hospital Course: The patient had an uncomplicated right partial nephrectomy.  She was sent to the floor postoperatively.  The evening of her surgery she did have nausea and vomiting, but by the following morning, postoperative day #1, she was tolerating a diet.  She was discharged home after successfully eating lunch, ambulating, and her pain under control.  JP drain was removed. Active Problems:   Renal mass   Day of Discharge BP 140/73 (BP Location: Right Arm)   Pulse 73   Temp 98.4 F (36.9 C) (Oral)   Resp 16   Ht 5\' 5"  (1.651 m)   Wt 166 lb (75.3 kg)   SpO2 92%   BMI 27.62 kg/m   No results found for this or any previous visit (from the past 24 hour(s)).  Physical Exam: General: Alert and awake oriented x3 not in any acute distress. HEENT: anicteric sclera, pupils reactive to light and accommodation CVS: S1-S2 clear no murmur rubs or gallops Chest: clear to auscultation bilaterally, no wheezing rales or rhonchi Abdomen: soft nontender, nondistended, normal bowel sounds, no organomegaly Extremities: no cyanosis, clubbing or edema noted bilaterally  Neuro: Cranial nerves II-XII intact, no focal neurological deficits  Disposition: Home  Diet: No restrictions  Activity: Restrictions discussed with patient   TESTS THAT NEED FOLLOW-UP  Review of pathology  DISCHARGE FOLLOW-UP   Follow-up Information     Hollace Hayward, NP Follow up on 01/14/2021.   Why: 9:15am Contact information: [address] [place] [address] [phone]                 Time spent on Discharge:  15 minutes  Signed: Lillette Boxer Rj Pedrosa 12/30/2020, 11:33 AM

## 2020-12-30 NOTE — Anesthesia Postprocedure Evaluation (Signed)
Anesthesia Post Note  Patient: Rhonda Wilkerson  Procedure(s) Performed: XI ROBOTIC ASSITED RIGHT PARTIAL NEPHRECTOMY (Right: Renal)     Patient location during evaluation: PACU Anesthesia Type: General Level of consciousness: awake and alert Pain management: pain level controlled Vital Signs Assessment: post-procedure vital signs reviewed and stable Respiratory status: spontaneous breathing, nonlabored ventilation, respiratory function stable and patient connected to nasal cannula oxygen Cardiovascular status: blood pressure returned to baseline and stable Postop Assessment: no apparent nausea or vomiting Anesthetic complications: no   No notable events documented.  Last Vitals:  Vitals:   12/29/20 0919 12/29/20 1326  BP: (!) 143/70 140/73  Pulse: 64 73  Resp: 18 16  Temp: 37.1 C 36.9 C  SpO2: 96% 92%    Last Pain:  Vitals:   12/29/20 1547  TempSrc:   PainSc: 4    Pain Goal: Patients Stated Pain Goal: 2 (12/29/20 1059)                 Tiajuana Amass

## 2020-12-31 LAB — SURGICAL PATHOLOGY

## 2021-01-14 DIAGNOSIS — D3000 Benign neoplasm of unspecified kidney: Secondary | ICD-10-CM | POA: Diagnosis not present

## 2021-01-15 DIAGNOSIS — F411 Generalized anxiety disorder: Secondary | ICD-10-CM | POA: Diagnosis not present

## 2021-01-15 DIAGNOSIS — K5090 Crohn's disease, unspecified, without complications: Secondary | ICD-10-CM | POA: Diagnosis not present

## 2021-01-15 DIAGNOSIS — R109 Unspecified abdominal pain: Secondary | ICD-10-CM | POA: Diagnosis not present

## 2021-01-15 DIAGNOSIS — K432 Incisional hernia without obstruction or gangrene: Secondary | ICD-10-CM | POA: Diagnosis not present

## 2021-01-21 DIAGNOSIS — R197 Diarrhea, unspecified: Secondary | ICD-10-CM | POA: Diagnosis not present

## 2021-01-21 DIAGNOSIS — R5383 Other fatigue: Secondary | ICD-10-CM | POA: Diagnosis not present

## 2021-01-21 DIAGNOSIS — R110 Nausea: Secondary | ICD-10-CM | POA: Diagnosis not present

## 2021-01-21 DIAGNOSIS — I10 Essential (primary) hypertension: Secondary | ICD-10-CM | POA: Diagnosis not present

## 2021-01-22 DIAGNOSIS — K432 Incisional hernia without obstruction or gangrene: Secondary | ICD-10-CM | POA: Diagnosis not present

## 2021-01-25 ENCOUNTER — Emergency Department (HOSPITAL_COMMUNITY)
Admission: EM | Admit: 2021-01-25 | Discharge: 2021-01-26 | Disposition: A | Discharge status: Home / Self Care | Payer: PPO | Attending: Emergency Medicine | Admitting: Emergency Medicine

## 2021-01-25 ENCOUNTER — Other Ambulatory Visit: Payer: Self-pay

## 2021-01-25 ENCOUNTER — Emergency Department (HOSPITAL_COMMUNITY): Payer: PPO

## 2021-01-25 ENCOUNTER — Encounter (HOSPITAL_COMMUNITY): Payer: Self-pay

## 2021-01-25 DIAGNOSIS — R17 Unspecified jaundice: Secondary | ICD-10-CM

## 2021-01-25 DIAGNOSIS — E876 Hypokalemia: Secondary | ICD-10-CM

## 2021-01-25 DIAGNOSIS — E871 Hypo-osmolality and hyponatremia: Secondary | ICD-10-CM | POA: Diagnosis not present

## 2021-01-25 DIAGNOSIS — R7309 Other abnormal glucose: Secondary | ICD-10-CM | POA: Diagnosis not present

## 2021-01-25 DIAGNOSIS — R1031 Right lower quadrant pain: Secondary | ICD-10-CM | POA: Diagnosis not present

## 2021-01-25 DIAGNOSIS — R739 Hyperglycemia, unspecified: Secondary | ICD-10-CM

## 2021-01-25 DIAGNOSIS — N1330 Unspecified hydronephrosis: Secondary | ICD-10-CM

## 2021-01-25 DIAGNOSIS — Z79899 Other long term (current) drug therapy: Secondary | ICD-10-CM | POA: Diagnosis not present

## 2021-01-25 DIAGNOSIS — R109 Unspecified abdominal pain: Secondary | ICD-10-CM | POA: Diagnosis not present

## 2021-01-25 DIAGNOSIS — D72829 Elevated white blood cell count, unspecified: Secondary | ICD-10-CM | POA: Insufficient documentation

## 2021-01-25 DIAGNOSIS — I10 Essential (primary) hypertension: Secondary | ICD-10-CM | POA: Diagnosis not present

## 2021-01-25 DIAGNOSIS — Z87891 Personal history of nicotine dependence: Secondary | ICD-10-CM | POA: Insufficient documentation

## 2021-01-25 DIAGNOSIS — E039 Hypothyroidism, unspecified: Secondary | ICD-10-CM | POA: Diagnosis not present

## 2021-01-25 LAB — CBC WITH DIFFERENTIAL/PLATELET
Abs Immature Granulocytes: 0.04 10*3/uL (ref 0.00–0.07)
Basophils Absolute: 0 10*3/uL (ref 0.0–0.1)
Basophils Relative: 0 %
Eosinophils Absolute: 0 10*3/uL (ref 0.0–0.5)
Eosinophils Relative: 0 %
HCT: 37.7 % (ref 36.0–46.0)
Hemoglobin: 12.4 g/dL (ref 12.0–15.0)
Immature Granulocytes: 0 %
Lymphocytes Relative: 4 %
Lymphs Abs: 0.6 10*3/uL — ABNORMAL LOW (ref 0.7–4.0)
MCH: 28.7 pg (ref 26.0–34.0)
MCHC: 32.9 g/dL (ref 30.0–36.0)
MCV: 87.3 fL (ref 80.0–100.0)
Monocytes Absolute: 0.6 10*3/uL (ref 0.1–1.0)
Monocytes Relative: 5 %
Neutro Abs: 12.3 10*3/uL — ABNORMAL HIGH (ref 1.7–7.7)
Neutrophils Relative %: 91 %
Platelets: 279 10*3/uL (ref 150–400)
RBC: 4.32 MIL/uL (ref 3.87–5.11)
RDW: 13.6 % (ref 11.5–15.5)
WBC: 13.6 10*3/uL — ABNORMAL HIGH (ref 4.0–10.5)
nRBC: 0 % (ref 0.0–0.2)

## 2021-01-25 LAB — URINALYSIS, ROUTINE W REFLEX MICROSCOPIC
Bacteria, UA: NONE SEEN
Bilirubin Urine: NEGATIVE
Glucose, UA: NEGATIVE mg/dL
Ketones, ur: NEGATIVE mg/dL
Leukocytes,Ua: NEGATIVE
Nitrite: NEGATIVE
Protein, ur: NEGATIVE mg/dL
Specific Gravity, Urine: >1.046 — ABNORMAL HIGH (ref 1.005–1.030)
pH: 5 (ref 5.0–8.0)

## 2021-01-25 LAB — COMPREHENSIVE METABOLIC PANEL
ALT: 11 U/L (ref 0–44)
AST: 23 U/L (ref 15–41)
Albumin: 4 g/dL (ref 3.5–5.0)
Alkaline Phosphatase: 73 U/L (ref 38–126)
Anion gap: 12 (ref 5–15)
BUN: 21 mg/dL — ABNORMAL HIGH (ref 6–20)
CO2: 26 mmol/L (ref 22–32)
Calcium: 9.4 mg/dL (ref 8.9–10.3)
Chloride: 96 mmol/L — ABNORMAL LOW (ref 98–111)
Creatinine, Ser: 1.09 mg/dL — ABNORMAL HIGH (ref 0.44–1.00)
GFR, Estimated: 60 mL/min — ABNORMAL LOW (ref >60)
Glucose, Bld: 232 mg/dL — ABNORMAL HIGH (ref 70–99)
Potassium: 3.2 mmol/L — ABNORMAL LOW (ref 3.5–5.1)
Sodium: 134 mmol/L — ABNORMAL LOW (ref 135–145)
Total Bilirubin: 1.6 mg/dL — ABNORMAL HIGH (ref 0.3–1.2)
Total Protein: 7.5 g/dL (ref 6.5–8.1)

## 2021-01-25 LAB — LIPASE, BLOOD: Lipase: 31 U/L (ref 11–51)

## 2021-01-25 MED ORDER — OXYCODONE HCL 5 MG PO TABS
10.0000 mg | ORAL_TABLET | Freq: Once | ORAL | Status: AC
  Administered 2021-01-25: 10 mg via ORAL
  Filled 2021-01-25: qty 2

## 2021-01-25 MED ORDER — POTASSIUM CHLORIDE CRYS ER 20 MEQ PO TBCR
40.0000 meq | EXTENDED_RELEASE_TABLET | Freq: Once | ORAL | Status: AC
  Administered 2021-01-25: 40 meq via ORAL
  Filled 2021-01-25: qty 2

## 2021-01-25 MED ORDER — IOHEXOL 350 MG/ML SOLN
80.0000 mL | Freq: Once | INTRAVENOUS | Status: AC | PRN
  Administered 2021-01-25: 80 mL via INTRAVENOUS

## 2021-01-25 MED ORDER — ONDANSETRON HCL 4 MG/2ML IJ SOLN
4.0000 mg | Freq: Once | INTRAMUSCULAR | Status: AC
  Administered 2021-01-25: 4 mg via INTRAVENOUS
  Filled 2021-01-25: qty 2

## 2021-01-25 NOTE — ED Provider Notes (Signed)
Emergency Medicine Provider Triage Evaluation Note  Rhonda Wilkerson , a 55 y.o. female  was evaluated in triage.  Pt complains of right lower quadrant abdominal pain.  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  She also subsequently developed a seroma over the incision site.  Been having pain is been worsening over the last few days.  Radiates to the lower back.  Reports associated vomiting and fever.  Also has a history of Crohn's.  Review of Systems  Positive:  Negative: See above   Physical Exam  BP 136/77 (BP Location: Right Arm)   Pulse (!) 102   Temp 98.7 F (37.1 C) (Oral)   Resp 16   Ht 5\' 5"  (1.651 m)   Wt 69.9 kg   SpO2 97%   BMI 25.63 kg/m  Gen:   Awake, no distress   Resp:  Normal effort  MSK:   Moves extremities without difficulty  Other:  Well-healed surgical incision over the central abdomen.  Right lower quadrant abdominal tenderness.  There is right paralumbar tenderness.  Medical Decision Making  Medically screening exam initiated at 6:42 PM.  Appropriate orders placed.  Rhonda Wilkerson was informed that the remainder of the evaluation will be completed by another provider, this initial triage assessment does not replace that evaluation, and the importance of remaining in the ED until their evaluation is complete.     Myna Bright [place], PA-C 01/25/21 1844    Dorie Rank, MD 01/25/21 2111

## 2021-01-25 NOTE — ED Notes (Signed)
Family member reports giving patient Tylenol while in the lobby.

## 2021-01-25 NOTE — ED Triage Notes (Signed)
Patient c/o RLQ abdominal pain that radiates into the right lower back and right upper thigh, vomiting x 3, and fever (100.2). patient had right kidney surgery.

## 2021-01-25 NOTE — ED Provider Notes (Signed)
[facility] DEPT Provider Note   CSN: [id number] Arrival date & time: 01/25/21  1746     History Chief Complaint  Patient presents with   Abdominal Pain    Rhonda Wilkerson is a 55 y.o. female.  The history is provided by the patient.  Abdominal Pain She has history of hypertension, Crohn's disease and is about 1 month status post right partial nephrectomy for a benign tumor and comes in with right flank pain which started yesterday.  Pain radiates to the right lower quadrant and also down into the right thigh.  At its worst, pain is rated at 10/10.  She has had some pain medication since arrival in the ED, current pain is down to 3/10.  There has been associated nausea and vomiting.  She has had chills and low-grade fever for the last 3 days with maximum temperature of 100.5.  She denies any urinary urgency, frequency, tenesmus, dysuria.  She has been evaluated by general surgeon for concern about a postoperative seroma and possible incisional hernia.   Past Medical History:  Diagnosis Date   Anxiety    Arthritis    Crohn's disease ([place])    Depression    Hypertension    Hypothyroid    Sleep apnea    uses cpap    Patient Active Problem List   Diagnosis Date Noted   Renal mass 12/28/2020    Past Surgical History:  Procedure Laterality Date   BOWEL RESECTION     BREAST REDUCTION SURGERY Bilateral 01/06/2020   Procedure: MAMMARY REDUCTION  (BREAST);  Surgeon: Irene Limbo, MD;  Location: [facility];  Service: Plastics;  Laterality: Bilateral;   BREAST SURGERY     CARPAL TUNNEL RELEASE     KNEE ARTHROSCOPY Right    OOPHORECTOMY Right    ovary     ROBOTIC ASSITED PARTIAL NEPHRECTOMY Right 12/28/2020   Procedure: XI ROBOTIC ASSITED RIGHT PARTIAL NEPHRECTOMY;  Surgeon: Ardis Hughs, MD;  Location: [facility];  Service: Urology;  Laterality: Right;   SCAR REVISION Bilateral 10/08/2020   Procedure: SCAR REVISION BILATERAL  BREASTS;  Surgeon: Irene Limbo, MD;  Location: [facility];  Service: Plastics;  Laterality: Bilateral;   THYROIDECTOMY, PARTIAL       OB History   No obstetric history on file.     Family History  Problem Relation Age of Onset   Hyperlipidemia Mother    Hypertension Mother    Hyperlipidemia Father    Hypertension Father    Atrial fibrillation Father     Social History   Tobacco Use   Smoking status: Former    Types: Cigarettes   Smokeless tobacco: Never  Vaping Use   Vaping Use: Never used  Substance Use Topics   Alcohol use: Yes    Comment: Socially    Drug use: Never    Home Medications Prior to Admission medications   Medication Sig Start Date End Date Taking? Authorizing Provider  Ascorbic Acid (VITAMIN C) 1000 MG tablet Take 1,000 mg by mouth daily.    [provider]  atorvastatin (LIPITOR) 10 MG tablet Take 10 mg by mouth daily.    [provider]  busPIRone (BUSPAR) 5 MG tablet Take 5 mg by mouth 2 (two) times daily.    [provider]  Cholecalciferol (VITAMIN D3) 125 MCG (5000 UT) CAPS Take 5,000 Units by mouth daily.    [provider]  Cyanocobalamin (VITAMIN B 12) 500 MCG TABS 2 tablet  [provider]  escitalopram (LEXAPRO) 10 MG tablet Take 10 mg by mouth daily.    [provider]  folic acid (FOLVITE) 1 MG tablet Take 1 mg by mouth daily.    [provider]  levothyroxine (SYNTHROID) 25 MCG tablet Take 25 mcg by mouth daily before breakfast.    [provider]  linaclotide (LINZESS) 145 MCG CAPS capsule Take 145 mcg by mouth daily.    [provider]  losartan-hydrochlorothiazide (HYZAAR) 100-25 MG tablet Take 1 tablet by mouth daily.    [provider]  oxyCODONE-acetaminophen (PERCOCET) 5-325 MG tablet Take 1 tablet by mouth every 8 (eight) hours as needed for severe pain. 12/29/20 12/29/21  Franchot Gallo, MD  Potassium 99 MG TABS Take  99 mg by mouth daily.    [provider]  traMADol (ULTRAM) 50 MG tablet Take 1-2 tablets (50-100 mg total) by mouth every 6 (six) hours as needed for moderate pain. 12/28/20   Ardis Hughs, MD  ustekinumab (STELARA) 90 MG/ML SOSY injection Inject 90 mg into the skin every 8 (eight) weeks. 10/18/16   [provider]  valACYclovir (VALTREX) 1000 MG tablet Take 1,000 mg by mouth daily as needed (outbreak).    [provider]  vitamin B-12 (CYANOCOBALAMIN) 1000 MCG tablet Take 1,000 mcg by mouth daily.    [provider]  vitamin E 180 MG (400 UNITS) capsule Take 400 Units by mouth daily.    [provider]  zinc gluconate 50 MG tablet Take 50 mg by mouth daily.    [provider]    Allergies    Patient has no known allergies.  Review of Systems   Review of Systems  Gastrointestinal:  Positive for abdominal pain.  All other systems reviewed and are negative.  Physical Exam Updated Vital Signs BP 100/67 (BP Location: Right Arm)   Pulse 73   Temp 97.8 F (36.6 C)   Resp 16   Ht 5\' 5"  (1.651 m)   Wt 69.9 kg   SpO2 96%   BMI 25.63 kg/m   Physical Exam Vitals and nursing note reviewed.  55 year old female, resting comfortably and in no acute distress. Vital signs are normal. Oxygen saturation is 96%, which is normal. Head is normocephalic and atraumatic. PERRLA, EOMI. Oropharynx is clear. Neck is nontender and supple without adenopathy or JVD. Back is nontender in the midline.  There is mild right CVA tenderness. Lungs are clear without rales, wheezes, or rhonchi. Chest is nontender. Heart has regular rate and rhythm without murmur. Abdomen is soft, flat, with mild tenderness in the right mid and lower abdomen.  There is no rebound or guarding.  There is some slight fullness to the abdominal wall in the right lower quadrant, but no other masses or hepatosplenomegaly and peristalsis is hypoactive. Extremities have no cyanosis  or edema, full range of motion is present. Skin is warm and dry without rash. Neurologic: Mental status is normal, cranial nerves are intact, moves all extremities equally.  ED Results / Procedures / Treatments   Labs (all labs ordered are listed, but only abnormal results are displayed) Labs Reviewed  COMPREHENSIVE METABOLIC PANEL - Abnormal; Notable for the following components:      Result Value   Sodium 134 (*)    Potassium 3.2 (*)    Chloride 96 (*)    Glucose, Bld 232 (*)    BUN 21 (*)    Creatinine, Ser 1.09 (*)    Total  Bilirubin 1.6 (*)    GFR, Estimated 60 (*)    All other components within normal limits  CBC WITH DIFFERENTIAL/PLATELET - Abnormal; Notable for the following components:   WBC 13.6 (*)    Neutro Abs 12.3 (*)    Lymphs Abs 0.6 (*)    All other components within normal limits  URINALYSIS, ROUTINE W REFLEX MICROSCOPIC - Abnormal; Notable for the following components:   Color, Urine STRAW (*)    Specific Gravity, Urine >1.046 (*)    Hgb urine dipstick MODERATE (*)    All other components within normal limits  CULTURE, BLOOD (ROUTINE X 2)  CULTURE, BLOOD (ROUTINE X 2)  URINE CULTURE  LIPASE, BLOOD   Radiology CT ABDOMEN PELVIS W CONTRAST  Result Date: 01/25/2021 CLINICAL DATA:  Right lower quadrant abdominal pain. EXAM: CT ABDOMEN AND PELVIS WITH CONTRAST TECHNIQUE: Multidetector CT imaging of the abdomen and pelvis was performed using the standard protocol following bolus administration of intravenous contrast. CONTRAST:  15mL OMNIPAQUE IOHEXOL 350 MG/ML SOLN COMPARISON:  CT abdomen pelvis dated 01/14/2021. FINDINGS: Lower chest: The visualized lung bases are clear. No intra-abdominal free air or free fluid. Hepatobiliary: The liver is unremarkable. No intrahepatic biliary dilatation. Multiple stones within the gallbladder. No pericholecystic fluid or evidence of acute cholecystitis by CT. Pancreas: Unremarkable. No pancreatic ductal dilatation or surrounding  inflammatory changes. Spleen: Normal in size without focal abnormality. Adrenals/Urinary Tract: The adrenal glands are unremarkable. Similar degree of right hydronephrosis with transition at the right ureteropelvic junction. No obstructing stone identified. Underlying urothelial neoplasm or stricture is not excluded. Areas of decreased enhancement primarily involving the lower pole of the right kidney likely represents a developing infarct. Pyelonephritis is not excluded correlation with urinalysis recommended. There is inflammatory changes surrounding the right ureter with edema extending into the right psoas muscle. There is a 1.9 x 1.4 cm fluid along the anterior right psoas muscle (48/2) which may represent edema or developing abscess. There is delayed excretion of contrast by the right kidney. The left kidney, left ureter, and urinary bladder appear unremarkable. Stomach/Bowel: There is postsurgical changes of the bowel with several anastomotic sutures. There is no bowel obstruction. The appendix is not identified with certainty, likely surgically absent. Vascular/Lymphatic: Mild aortoiliac atherosclerotic disease. The IVC is unremarkable. No portal venous gas. There is no adenopathy. Reproductive: The uterus is anteverted.  No adnexal masses. Other: Midline vertical anterior abdominal wall incisional scar. Musculoskeletal: Mild degenerative changes. No acute osseous pathology. IMPRESSION: 1. Similar degree of right hydronephrosis with transition at the right ureteropelvic junction. Interval development of areas of infarct involving the lower pole of the right kidney. 2. Right periureteric inflammatory changes with possible developing loculated collection or abscess along the right psoas muscle. No drainable fluid collection at this time. 3. Cholelithiasis. 4. Postsurgical changes of the bowel. No bowel obstruction. 5. Aortic Atherosclerosis (ICD10-I70.0). Electronically Signed   By: Anner Crete M.D.   On:  01/25/2021 20:41    Procedures Procedures   Medications Ordered in ED Medications  potassium chloride SA (KLOR-CON M) CR tablet 40 mEq (has no administration in time range)  iohexol (OMNIPAQUE) 350 MG/ML injection 80 mL (80 mLs Intravenous Contrast Given 01/25/21 2021)  oxyCODONE (Oxy IR/ROXICODONE) immediate release tablet 10 mg (10 mg Oral Given 01/25/21 2120)    ED Course  I have reviewed the triage vital signs and the nursing notes.  Pertinent labs & imaging results that were available during my care of the patient were reviewed by  me and considered in my medical decision making (see chart for details).   MDM Rules/Calculators/A&P                         Right flank pain and patient was 1 month status post partial nephrectomy.  Fever and chills are concerning for possible infection.  Labs show mild hypokalemia and hyponatremia as well as elevated glucose.  Prior glucose readings had been in the range of prediabetes, suspect hyperglycemia is stress related.  There is a mild leukocytosis with left shift.  Incidental finding of mildly elevated bilirubin which had been present previously, possible Gilbert's disease.  She is given a dose of oral potassium.  CT scan ordered at triage shows right hydronephrosis which is stable compared with CT scan of 11/28.  There is also a small infarct in the right kidney, and some stranding which is concerning for possible pyelonephritis, but no drainable fluid collection.  Urinalysis shows hematuria but no pyuria.  Case is discussed with Dr. Tresa Moore, on-call for urology.  He has reviewed the CT scan and feeling is that she does not need any immediate surgical intervention.  Has recommended discharge with prescription for trimethoprim-sulfamethoxazole for 1 week and follow-up in the urology office sometime in the next week.  Tonight we will obtain blood and urine cultures.  She is discharged with prescription for oxycodone for pain, ondansetron for nausea, K-Dur,  trimethoprim-sulfamethoxazole.  Given strict return precautions for significant fever or inadequate pain control.  Final Clinical Impression(s) / ED Diagnoses Final diagnoses:  Right flank pain  Hydronephrosis, right  Hypokalemia  Elevated random blood glucose level  Serum total bilirubin elevated    Rx / DC Orders ED Discharge Orders          Ordered    sulfamethoxazole-trimethoprim (BACTRIM DS) 800-160 MG tablet  2 times daily        01/26/21 0032    oxyCODONE (ROXICODONE) 5 MG immediate release tablet  Every 4 hours PRN        01/26/21 0032    ondansetron (ZOFRAN) 4 MG tablet  Every 6 hours PRN        01/26/21 0032    potassium chloride SA (KLOR-CON M) 20 MEQ tablet  2 times daily        01/26/21 0037             Delora Fuel, MD 04/88/89 [phone]

## 2021-01-26 MED ORDER — OXYCODONE HCL 5 MG PO TABS: 5.0000 mg | ORAL_TABLET | ORAL | 0 refills | Status: DC | PRN

## 2021-01-26 MED ORDER — POTASSIUM CHLORIDE CRYS ER 20 MEQ PO TBCR: 20.0000 meq | EXTENDED_RELEASE_TABLET | Freq: Two times a day (BID) | ORAL | 0 refills | Status: DC

## 2021-01-26 MED ORDER — SULFAMETHOXAZOLE-TRIMETHOPRIM 800-160 MG PO TABS
1.0000 | ORAL_TABLET | Freq: Once | ORAL | Status: AC
  Administered 2021-01-26: 1 via ORAL
  Filled 2021-01-26: qty 1

## 2021-01-26 MED ORDER — SULFAMETHOXAZOLE-TRIMETHOPRIM 800-160 MG PO TABS: 1.0000 | ORAL_TABLET | Freq: Two times a day (BID) | ORAL | 0 refills | Status: DC

## 2021-01-26 MED ORDER — ONDANSETRON HCL 4 MG PO TABS: 4.0000 mg | ORAL_TABLET | Freq: Four times a day (QID) | ORAL | 0 refills | Status: DC | PRN

## 2021-01-26 MED ORDER — OXYCODONE HCL 5 MG PO TABS
10.0000 mg | ORAL_TABLET | Freq: Once | ORAL | Status: AC
  Administered 2021-01-26: 10 mg via ORAL
  Filled 2021-01-26: qty 2

## 2021-01-26 NOTE — ED Notes (Signed)
Unsuccessful attempts for 2nd set of blood cultures.

## 2021-01-26 NOTE — Discharge Instructions (Addendum)
Your CT scan suggest there may be an infection around the right kidney.  You have been given a prescription for an antibiotic which you need to take twice a day.  You have also been given a prescription for medicine for pain and nausea to use as needed.  Return to the emergency department if you develop a fever higher than 101.5, or if pain is not being adequately controlled with the medication prescribed.  You will need to follow-up with the urologist next week.  Your blood sugar was a little high today.  This will need to be followed up through your primary care provider.  If your blood sugar continues to stay elevated, you may need to be on medication to control it.

## 2021-01-27 LAB — URINE CULTURE: Culture: NO GROWTH

## 2021-01-28 ENCOUNTER — Other Ambulatory Visit: Payer: PPO

## 2021-01-28 DIAGNOSIS — D3000 Benign neoplasm of unspecified kidney: Secondary | ICD-10-CM | POA: Diagnosis not present

## 2021-01-30 DIAGNOSIS — D3000 Benign neoplasm of unspecified kidney: Secondary | ICD-10-CM | POA: Diagnosis not present

## 2021-01-31 ENCOUNTER — Inpatient Hospital Stay (HOSPITAL_COMMUNITY): Payer: PPO

## 2021-01-31 ENCOUNTER — Encounter (HOSPITAL_COMMUNITY)
Admission: RE | Disposition: A | Discharge status: Home / Self Care | Payer: Self-pay | Source: Ambulatory Visit | Attending: Urology

## 2021-01-31 ENCOUNTER — Inpatient Hospital Stay (HOSPITAL_COMMUNITY): Payer: PPO | Admitting: Certified Registered"

## 2021-01-31 ENCOUNTER — Other Ambulatory Visit: Payer: Self-pay

## 2021-01-31 ENCOUNTER — Encounter (HOSPITAL_COMMUNITY): Payer: Self-pay | Admitting: Urology

## 2021-01-31 ENCOUNTER — Ambulatory Visit (HOSPITAL_COMMUNITY)
Admission: RE | Admit: 2021-01-31 | Discharge: 2021-01-31 | Disposition: A | Discharge status: Home / Self Care | Payer: PPO | Source: Ambulatory Visit | Attending: Urology | Admitting: Urology

## 2021-01-31 DIAGNOSIS — I10 Essential (primary) hypertension: Secondary | ICD-10-CM | POA: Insufficient documentation

## 2021-01-31 DIAGNOSIS — N131 Hydronephrosis with ureteral stricture, not elsewhere classified: Secondary | ICD-10-CM

## 2021-01-31 DIAGNOSIS — Z87891 Personal history of nicotine dependence: Secondary | ICD-10-CM | POA: Insufficient documentation

## 2021-01-31 DIAGNOSIS — E039 Hypothyroidism, unspecified: Secondary | ICD-10-CM | POA: Diagnosis not present

## 2021-01-31 DIAGNOSIS — R390 Extravasation of urine: Principal | ICD-10-CM | POA: Insufficient documentation

## 2021-01-31 DIAGNOSIS — D3001 Benign neoplasm of right kidney: Secondary | ICD-10-CM | POA: Diagnosis not present

## 2021-01-31 DIAGNOSIS — Z905 Acquired absence of kidney: Secondary | ICD-10-CM | POA: Diagnosis not present

## 2021-01-31 DIAGNOSIS — N130 Hydronephrosis with ureteropelvic junction obstruction: Secondary | ICD-10-CM | POA: Diagnosis not present

## 2021-01-31 HISTORY — PX: URETEROSCOPY: SHX842

## 2021-01-31 LAB — GRAM STAIN

## 2021-01-31 LAB — CULTURE, BLOOD (ROUTINE X 2): Culture: NO GROWTH

## 2021-01-31 SURGERY — URETEROSCOPY
Anesthesia: General | Laterality: Right

## 2021-01-31 MED ORDER — SODIUM CHLORIDE 0.9 % IV SOLN
INTRAVENOUS | Status: DC | PRN
  Administered 2021-01-31: 4 mL

## 2021-01-31 MED ORDER — SUCCINYLCHOLINE CHLORIDE 200 MG/10ML IV SOSY
PREFILLED_SYRINGE | INTRAVENOUS | Status: DC | PRN
  Administered 2021-01-31: 100 mg via INTRAVENOUS

## 2021-01-31 MED ORDER — PROMETHAZINE HCL 25 MG/ML IJ SOLN: 6.2500 mg | INTRAMUSCULAR | Status: DC | PRN

## 2021-01-31 MED ORDER — ROCURONIUM BROMIDE 10 MG/ML (PF) SYRINGE
PREFILLED_SYRINGE | INTRAVENOUS | Status: AC
  Filled 2021-01-31: qty 10

## 2021-01-31 MED ORDER — SULFAMETHOXAZOLE-TRIMETHOPRIM 800-160 MG PO TABS: 1.0000 | ORAL_TABLET | Freq: Two times a day (BID) | ORAL | 0 refills | Status: AC

## 2021-01-31 MED ORDER — PHENAZOPYRIDINE HCL 200 MG PO TABS: 200.0000 mg | ORAL_TABLET | Freq: Three times a day (TID) | ORAL | 0 refills | Status: DC | PRN

## 2021-01-31 MED ORDER — AMISULPRIDE (ANTIEMETIC) 5 MG/2ML IV SOLN: 10.0000 mg | Freq: Once | INTRAVENOUS | Status: DC | PRN

## 2021-01-31 MED ORDER — EPHEDRINE SULFATE-NACL 50-0.9 MG/10ML-% IV SOSY
PREFILLED_SYRINGE | INTRAVENOUS | Status: DC | PRN
  Administered 2021-01-31: 7.5 mg via INTRAVENOUS

## 2021-01-31 MED ORDER — MIDAZOLAM HCL 5 MG/5ML IJ SOLN
INTRAMUSCULAR | Status: DC | PRN
  Administered 2021-01-31: 2 mg via INTRAVENOUS

## 2021-01-31 MED ORDER — PROPOFOL 10 MG/ML IV BOLUS
INTRAVENOUS | Status: DC | PRN
  Administered 2021-01-31: 160 mg via INTRAVENOUS

## 2021-01-31 MED ORDER — LACTATED RINGERS IV SOLN: INTRAVENOUS | Status: DC

## 2021-01-31 MED ORDER — SODIUM CHLORIDE 0.9 % IR SOLN
Status: DC | PRN
  Administered 2021-01-31: 3000 mL

## 2021-01-31 MED ORDER — ACETAMINOPHEN 500 MG PO TABS
1000.0000 mg | ORAL_TABLET | Freq: Once | ORAL | Status: AC
  Administered 2021-01-31: 1000 mg via ORAL
  Filled 2021-01-31: qty 2

## 2021-01-31 MED ORDER — ONDANSETRON HCL 4 MG/2ML IJ SOLN
INTRAMUSCULAR | Status: DC | PRN
  Administered 2021-01-31: 4 mg via INTRAVENOUS

## 2021-01-31 MED ORDER — FENTANYL CITRATE (PF) 100 MCG/2ML IJ SOLN
INTRAMUSCULAR | Status: DC | PRN
  Administered 2021-01-31: 50 ug via INTRAVENOUS
  Administered 2021-01-31 (×2): 25 ug via INTRAVENOUS

## 2021-01-31 MED ORDER — CHLORHEXIDINE GLUCONATE 0.12 % MT SOLN
15.0000 mL | Freq: Once | OROMUCOSAL | Status: AC
  Administered 2021-01-31: 15 mL via OROMUCOSAL

## 2021-01-31 MED ORDER — FENTANYL CITRATE (PF) 100 MCG/2ML IJ SOLN
INTRAMUSCULAR | Status: AC
  Filled 2021-01-31: qty 2

## 2021-01-31 MED ORDER — DEXAMETHASONE SODIUM PHOSPHATE 10 MG/ML IJ SOLN
INTRAMUSCULAR | Status: DC | PRN
Start: 2021-01-31 — End: 2021-01-31
  Administered 2021-01-31: 10 mg via INTRAVENOUS

## 2021-01-31 MED ORDER — ALBUMIN HUMAN 5 % IV SOLN: INTRAVENOUS | Status: DC | PRN

## 2021-01-31 MED ORDER — BELLADONNA ALKALOIDS-OPIUM 16.2-60 MG RE SUPP
RECTAL | Status: DC | PRN
  Administered 2021-01-31: 1 via RECTAL

## 2021-01-31 MED ORDER — LIDOCAINE 2% (20 MG/ML) 5 ML SYRINGE
INTRAMUSCULAR | Status: DC | PRN
  Administered 2021-01-31: 60 mg via INTRAVENOUS

## 2021-01-31 MED ORDER — CEFAZOLIN SODIUM-DEXTROSE 2-4 GM/100ML-% IV SOLN
2.0000 g | Freq: Once | INTRAVENOUS | Status: AC
  Administered 2021-01-31: 2 g via INTRAVENOUS
  Filled 2021-01-31: qty 100

## 2021-01-31 MED ORDER — FENTANYL CITRATE PF 50 MCG/ML IJ SOSY: 25.0000 ug | PREFILLED_SYRINGE | INTRAMUSCULAR | Status: DC | PRN

## 2021-01-31 MED ORDER — DEXAMETHASONE SODIUM PHOSPHATE 10 MG/ML IJ SOLN
INTRAMUSCULAR | Status: AC
  Filled 2021-01-31: qty 1

## 2021-01-31 MED ORDER — BELLADONNA ALKALOIDS-OPIUM 16.2-30 MG RE SUPP
RECTAL | Status: AC
  Filled 2021-01-31: qty 1

## 2021-01-31 MED ORDER — MIDAZOLAM HCL 2 MG/2ML IJ SOLN
INTRAMUSCULAR | Status: AC
  Filled 2021-01-31: qty 2

## 2021-01-31 SURGICAL SUPPLY — 16 items
BAG URO CATCHER STRL LF (MISCELLANEOUS) ×2 IMPLANT
BASKET ZERO TIP NITINOL 2.4FR (BASKET) IMPLANT
CATH URETL OPEN 5X70 (CATHETERS) ×2 IMPLANT
CLOTH BEACON ORANGE TIMEOUT ST (SAFETY) ×2 IMPLANT
EXTRACTOR STONE 1.7FRX115CM (UROLOGICAL SUPPLIES) IMPLANT
GLOVE SURG ENC TEXT LTX SZ7.5 (GLOVE) ×2 IMPLANT
GOWN STRL REUS W/TWL XL LVL3 (GOWN DISPOSABLE) ×2 IMPLANT
GUIDEWIRE ANG ZIPWIRE 038X150 (WIRE) IMPLANT
GUIDEWIRE STR DUAL SENSOR (WIRE) ×2 IMPLANT
MANIFOLD NEPTUNE II (INSTRUMENTS) ×2 IMPLANT
PACK CYSTO (CUSTOM PROCEDURE TRAY) ×2 IMPLANT
SHEATH NAVIGATOR HD 11/13X28 (SHEATH) IMPLANT
SHEATH NAVIGATOR HD 11/13X36 (SHEATH) IMPLANT
STENT URET 6FRX24 CONTOUR (STENTS) ×1 IMPLANT
TUBING CONNECTING 10 (TUBING) ×2 IMPLANT
TUBING UROLOGY SET (TUBING) ×2 IMPLANT

## 2021-01-31 NOTE — Discharge Instructions (Signed)
DISCHARGE INSTRUCTIONS FOR KIDNEY STONE/URETERAL STENT   MEDICATIONS:  1.  Resume all your other meds from home  2. Pyridium is to help with the burning/stinging when you urinate.   ACTIVITY:  1. No strenuous activity x 1week  2. No driving while on narcotic pain medications  3. Drink plenty of water  4. Continue to walk at home - you can still get blood clots when you are at home, so keep active, but don't over do it.  5. May return to work/school tomorrow or when you feel ready   BATHING:  1. You can shower and we recommend daily showers    SIGNS/SYMPTOMS TO CALL:  Please call us if you have a fever greater than 101.5, uncontrolled nausea/vomiting, uncontrolled pain, dizziness, unable to urinate, bloody urine, chest pain, shortness of breath, leg swelling, leg pain, redness around wound, drainage from wound, or any other concerns or questions.   You can reach [place] at [phone].   FOLLOW-UP:  1. You have an Monday at 12pm w/ Dr. Louis Meckel.

## 2021-01-31 NOTE — Anesthesia Procedure Notes (Signed)
Procedure Name: Intubation Date/Time: 01/31/2021 6:17 PM Performed by: Cleda Daub, CRNA Pre-anesthesia Checklist: Patient identified, Emergency Drugs available, Suction available and Patient being monitored Patient Re-evaluated:Patient Re-evaluated prior to induction Oxygen Delivery Method: Circle system utilized Preoxygenation: Pre-oxygenation with 100% oxygen Induction Type: IV induction, Rapid sequence and Cricoid Pressure applied Laryngoscope Size: Mac and 3 Grade View: Grade I Tube type: Oral Tube size: 7.0 mm Number of attempts: 1 Airway Equipment and Method: Stylet and Oral airway Placement Confirmation: ETT inserted through vocal cords under direct vision, positive ETCO2 and breath sounds checked- equal and bilateral Secured at: 21 cm Tube secured with: Tape Dental Injury: Teeth and Oropharynx as per pre-operative assessment

## 2021-01-31 NOTE — Op Note (Signed)
Preoperative diagnosis:  Right hydronephrosis Right-sided flank and abdominal pain with radiation down the anterior aspect of the right thigh  Postoperative diagnosis:  Right mid ureteral contrast extravasation and presumed thermal injury  Procedure:  Cystoscopy right ureteral stent placement right retrograde pyelography with interpretation  Right ureteroscopy  Surgeon: Ardis Hughs, MD  Anesthesia: General  Complications: None  Intraoperative findings:   The distal retrograde pyelogram was normal.  The patient had significant contrast extravasation in the midportion of the ureter approximately 6 cm from the UPJ.  There was some continuity, as there was also contrast up in the collecting system.  I was able to get a wire across it and a stent was placed.  Drains: 6 French x26 cm double-J ureteral stent  EBL: Minimal  Specimens: Right renal pelvis urine culture  Indication: Rhonda Wilkerson is a 55 y.o. patient with history of a right partial nephrectomy for an AML who over the past 5 days has developed right-sided flank and abdominal pain that radiates down the anterior aspect of her right leg. After reviewing the management options for treatment, he elected to proceed with the above surgical procedure(s). We have discussed the potential benefits and risks of the procedure, side effects of the proposed treatment, the likelihood of the patient achieving the goals of the procedure, and any potential problems that might occur during the procedure or recuperation. Informed consent has been obtained.  Description of procedure:  The patient was taken to the operating room and general anesthesia was induced.  The patient was placed in the dorsal lithotomy position, prepped and draped in the usual sterile fashion, and preoperative antibiotics were administered. A preoperative time-out was performed.   Cystourethroscopy was performed.  The patients urethra was examined and was  normal. The bladder was then systematically examined in its entirety. There was no evidence for any bladder tumors, stones, or other mucosal pathology.    Attention then turned to the rightureteral orifice and a ureteral catheter was used to intubate the ureteral orifice.  Omnipaque contrast was injected through the ureteral catheter and a retrograde pyelogram was performed with findings as dictated above.  A 0.38 sensor guidewire was then advanced up the right ureter, which I could not advance all the way up into the renal pelvis.  I then at this point opted to advance a short semirigid ureteroscope up to the area of contrast extravasation.  There I was able to advance the wire across the area of extravasation into the renal pelvis under fluoroscopic guidance.  The wire was then backloaded through the cystoscope and a ureteral stent was advance over the wire using Seldinger technique.  The stent was positioned appropriately under fluoroscopic and cystoscopic guidance.  The wire was then removed with an adequate stent curl noted in the renal pelvis as well as in the bladder.  The bladder was then emptied and the procedure ended.  The patient appeared to tolerate the procedure well and without complications.  The patient was able to be awakened and transferred to the recovery unit in satisfactory condition.    Ardis Hughs, M.D.

## 2021-01-31 NOTE — Anesthesia Preprocedure Evaluation (Addendum)
Anesthesia Evaluation  Patient identified by MRN, date of birth, ID band Patient awake    Reviewed: Allergy & Precautions, NPO status , Patient's Chart, lab work & pertinent test results  History of Anesthesia Complications Negative for: history of anesthetic complications  Airway Mallampati: II  TM Distance: >3 FB Neck ROM: Full    Dental  (+) Dental Advisory Given   Pulmonary neg pulmonary ROS, Continuous Positive Airway Pressure Ventilation , former smoker,    breath sounds clear to auscultation       Cardiovascular hypertension, Pt. on medications  Rhythm:Regular Rate:Normal     Neuro/Psych PSYCHIATRIC DISORDERS Anxiety Depression negative neurological ROS     GI/Hepatic negative GI ROS, Neg liver ROS,   Endo/Other  Hypothyroidism   Renal/GU      Musculoskeletal negative musculoskeletal ROS (+)   Abdominal   Peds  Hematology negative hematology ROS (+)   Anesthesia Other Findings   Reproductive/Obstetrics                            Anesthesia Physical  Anesthesia Plan  ASA: 2 and emergent  Anesthesia Plan: General   Post-op Pain Management: Tylenol PO (pre-op)   Induction: Intravenous, Rapid sequence and Cricoid pressure planned  PONV Risk Score and Plan: 3 and Dexamethasone, Treatment may vary due to age or medical condition, Midazolam and Ondansetron  Airway Management Planned: Oral ETT  Additional Equipment:   Intra-op Plan:   Post-operative Plan: Extubation in OR  Informed Consent: I have reviewed the patients History and Physical, chart, labs and discussed the procedure including the risks, benefits and alternatives for the proposed anesthesia with the patient or authorized representative who has indicated his/her understanding and acceptance.     Dental advisory given  Plan Discussed with: Anesthesiologist and CRNA  Anesthesia Plan Comments:        Anesthesia Quick Evaluation

## 2021-01-31 NOTE — Transfer of Care (Signed)
Immediate Anesthesia Transfer of Care Note  Patient: [name]  Procedure(s) Performed: CYSTOSCOPY WITH RETROGRADE PYELOGRAM/URETERAL STENT PLACEMENT (Right)  Patient Location: PACU  Anesthesia Type:General  Level of Consciousness: awake, alert , oriented and patient cooperative  Airway & Oxygen Therapy: Patient Spontanous Breathing and Patient connected to face mask oxygen  Post-op Assessment: Report given to RN, Post -op Vital signs reviewed and stable and Patient moving all extremities X 4  Post vital signs: stable  Last Vitals:  Vitals Value Taken Time  BP 114/60 01/31/21 1900  Temp    Pulse 92 01/31/21 1903  Resp 19 01/31/21 1903  SpO2 99 % 01/31/21 1903  Vitals shown include unvalidated device data.  Last Pain:  Vitals:   01/31/21 1752  TempSrc:   PainSc: 2       Patients Stated Pain Goal: 2 (69/86/14 8307)  Complications: No notable events documented.

## 2021-01-31 NOTE — H&P (Signed)
55 year old female who is 1 month status post right partial nephrectomy. Her postoperative course has been complicated by a incarcerated piece of omentum in her midline incision that was creating some pain. This is subsequently resolved. However, over the past week she has developed pain over the right abdomen and flank that is radiating down anteriorly or on her thigh. She has had low-grade fevers. Her pain has been very difficult to control. She did have a CT scan that showed a small area of fluid in this area, but no other findings. There was some inflammation within the psoas, the right kidney was delayed in excretion. There was mild hydronephrosis.   Over the course of the last 5 days the patient's symptoms have not progressed, but have not improved. She has been on antibiotics, and denies any fevers. She denies any voiding symptoms.     ALLERGIES: No Allergies    MEDICATIONS: Atorvastatin Calcium 10 mg tablet  Buspirone Hcl 5 mg tablet  Escitalopram Oxalate 10 mg tablet  Folic Acid 1 mg tablet  Hydrocodone-Acetaminophen 5 mg-325 mg tablet 1-2 tablet PO Q 6 H PRN  Levothyroxine 25 mcg capsule  Linzess 145 mcg capsule  Losartan-Hydrochlorothiazide 100 mg-25 mg tablet  Ondansetron Hcl 4 mg tablet  Oxycodone Hcl 5 mg tablet 1-2 tablet PO Q 6 H PRN severe post nephrectomy pain  Oxycodone Hcl 5 mg tablet  Potassium 99 mg capsule  Potassium Chloride 20 meq tablet, extended release  Sulfamethoxazole-Trimethoprim 800 mg-160 mg tablet 1 tablet PO BID  Vitamin B12  Vitamin C  Vitamin D  Vitamin E  Zinc     GU PSH: Partial nephrectomy (laparoscopic) - 12/28/2020       PSH Notes: Breast reduction (12/2019), bowel resection (08/2014), right knee meniscus (11/2017), right ovary removed (10/2002), partial thyroidectomy (2006), Breast reduction (10/08/2020)   NON-GU PSH: Carpal tunnel surgery, 08/2011     GU PMH: Benign Neo Kidney, Unspec - 01/30/2021, - 01/28/2021, - 01/14/2021, -  12/18/2020 Ischemia and infarction of kidney - 01/28/2021 Benign tumor right kidney - 10/09/2020      PMH Notes: Crohns   NON-GU PMH: Anxiety Arthritis Depression Hypercholesterolemia Hypertension Hypothyroidism Sleep Apnea    FAMILY HISTORY: Atrial Fibrillation - Father, Brother Breast Cancer - Runs in Family Glaucoma - Father nephrolithiasis - Brother   SOCIAL HISTORY: Marital Status: Unknown Preferred Language: English; Ethnicity: Not Hispanic Or Latino; Race: White Current Smoking Status: Patient does not smoke anymore.   Tobacco Use Assessment Completed: Used Tobacco in last 30 days? Does not use smokeless tobacco. Social Drinker.  Does not use drugs. Drinks 2 caffeinated drinks per day. Has not had a blood transfusion.     Notes: quit smoking 10 years ago 1/2 pack per week  ETOH beer twice per week    REVIEW OF SYSTEMS:    GU Review Female:   Patient denies frequent urination, hard to postpone urination, burning /pain with urination, get up at night to urinate, leakage of urine, stream starts and stops, trouble starting your stream, have to strain to urinate, and being pregnant.  Gastrointestinal (Upper):   Patient denies nausea, vomiting, and indigestion/ heartburn.  Gastrointestinal (Lower):   Patient denies diarrhea and constipation.  Constitutional:   Patient reports night sweats. Patient denies fever, weight loss, and fatigue.  Skin:   Patient denies skin rash/ lesion and itching.  Eyes:   Patient denies blurred vision and double vision.  Ears/ Nose/ Throat:   Patient denies sore throat and sinus problems.  Hematologic/Lymphatic:   Patient denies swollen glands and easy bruising.  Cardiovascular:   Patient denies leg swelling and chest pains.  Respiratory:   Patient denies cough and shortness of breath.  Endocrine:   Patient denies excessive thirst.  Musculoskeletal:   Patient denies back pain and joint pain.  Neurological:   Patient denies headaches and  dizziness.  Psychologic:   Patient denies depression and anxiety.   Notes: Abdominal swelling, right thigh pain    VITAL SIGNS:      01/31/2021 04:41 PM  BP 102/67 mmHg  Pulse 66 /min   GU PHYSICAL EXAMINATION:      Notes: Patient's abdomen is soft, nontender  Patient has mild right flank tenderness   MULTI-SYSTEM PHYSICAL EXAMINATION:    Constitutional: Well-nourished. No physical deformities. Normally developed. Good grooming.   Respiratory: Normal breath sounds. No labored breathing, no use of accessory muscles.   Cardiovascular: Regular rate and rhythm. No murmur, no gallop. Normal temperature, normal extremity pulses, no swelling, no varicosities.      Complexity of Data:  Source Of History:  Patient  Records Review:   Previous Doctor Records, Previous Patient Records, POC Tool  Urine Test Review:   Urinalysis  X-Ray Review: C.T. Abdomen/Pelvis: Reviewed Films. Discussed With Patient.     PROCEDURES: None   ASSESSMENT:      ICD-10 Details  1 GU:   Benign tumor right kidney - D30.01   2   Hydronephrosis - N13.0    PLAN:           Document Letter(s):  Created for Patient: Clinical Summary         Notes:   The patient has an area on her CT scan from 1 week ago that shows a small possible organizing fluid collection in the right psoas. This appears to be causing some obstruction of the right ureter with delayed emptying. I do not think the patient has infection as all her urine and blood cultures have been negative to date. She continues to have low-grade fevers. I worry the patient may have a small contained urine leak that is creating psoas muscle irritation and contributing to the pain running down the anterior aspect of her thigh. This is also creating some delayed emptying of her right ureter. I recommended that we proceed to the operating room for a right ureteral stent. I described the surgery as well as the associated stent discomfort. I am hoping this will actually  make her feel better and allow the area along the ureter to heal without impacting the kidney. If she does have a small contained leak this should help that heal as well.   Plan is to do this urgently tonight.

## 2021-02-01 ENCOUNTER — Encounter (HOSPITAL_COMMUNITY): Payer: Self-pay | Admitting: Urology

## 2021-02-01 LAB — URINE CULTURE: Culture: NO GROWTH

## 2021-02-01 NOTE — Anesthesia Postprocedure Evaluation (Signed)
Anesthesia Post Note  Patient: Rhonda Wilkerson  Procedure(s) Performed: CYSTOSCOPY WITH RETROGRADE PYELOGRAM/URETERAL STENT PLACEMENT (Right)     Patient location during evaluation: PACU Anesthesia Type: General Level of consciousness: sedated Pain management: pain level controlled Vital Signs Assessment: post-procedure vital signs reviewed and stable Respiratory status: spontaneous breathing and respiratory function stable Cardiovascular status: stable Postop Assessment: no apparent nausea or vomiting Anesthetic complications: no   No notable events documented.  Last Vitals:  Vitals:   01/31/21 1930 01/31/21 2015  BP: 124/70 (!) 110/57  Pulse: 89 74  Resp: (!) 21 18  Temp:  (!) 36.4 C  SpO2: 94% 97%    Last Pain:  Vitals:   01/31/21 2015  TempSrc:   PainSc: 0-No pain                 [name]

## 2021-02-03 LAB — ANAEROBIC CULTURE W GRAM STAIN

## 2021-02-06 ENCOUNTER — Observation Stay (HOSPITAL_COMMUNITY)
Admission: AD | Admit: 2021-02-06 | Discharge: 2021-02-07 | Disposition: A | Discharge status: Home / Self Care | Payer: PPO | Source: Ambulatory Visit | Attending: Urology | Admitting: Urology

## 2021-02-06 ENCOUNTER — Encounter (HOSPITAL_COMMUNITY): Payer: Self-pay | Admitting: Urology

## 2021-02-06 ENCOUNTER — Other Ambulatory Visit: Payer: Self-pay

## 2021-02-06 DIAGNOSIS — N1330 Unspecified hydronephrosis: Secondary | ICD-10-CM | POA: Diagnosis not present

## 2021-02-06 DIAGNOSIS — D3001 Benign neoplasm of right kidney: Principal | ICD-10-CM | POA: Insufficient documentation

## 2021-02-06 DIAGNOSIS — E039 Hypothyroidism, unspecified: Secondary | ICD-10-CM | POA: Diagnosis not present

## 2021-02-06 DIAGNOSIS — N131 Hydronephrosis with ureteral stricture, not elsewhere classified: Secondary | ICD-10-CM

## 2021-02-06 DIAGNOSIS — N130 Hydronephrosis with ureteropelvic junction obstruction: Secondary | ICD-10-CM | POA: Insufficient documentation

## 2021-02-06 DIAGNOSIS — I10 Essential (primary) hypertension: Secondary | ICD-10-CM | POA: Insufficient documentation

## 2021-02-06 DIAGNOSIS — Z87891 Personal history of nicotine dependence: Secondary | ICD-10-CM | POA: Insufficient documentation

## 2021-02-06 DIAGNOSIS — K6812 Psoas muscle abscess: Secondary | ICD-10-CM

## 2021-02-06 LAB — CBC
HCT: 34.3 % — ABNORMAL LOW (ref 36.0–46.0)
Hemoglobin: 10.7 g/dL — ABNORMAL LOW (ref 12.0–15.0)
MCH: 27.9 pg (ref 26.0–34.0)
MCHC: 31.2 g/dL (ref 30.0–36.0)
MCV: 89.3 fL (ref 80.0–100.0)
Platelets: 403 10*3/uL — ABNORMAL HIGH (ref 150–400)
RBC: 3.84 MIL/uL — ABNORMAL LOW (ref 3.87–5.11)
RDW: 15.5 % (ref 11.5–15.5)
WBC: 22.5 10*3/uL — ABNORMAL HIGH (ref 4.0–10.5)
nRBC: 0 % (ref 0.0–0.2)

## 2021-02-06 LAB — BASIC METABOLIC PANEL
Anion gap: 8 (ref 5–15)
BUN: 29 mg/dL — ABNORMAL HIGH (ref 6–20)
CO2: 21 mmol/L — ABNORMAL LOW (ref 22–32)
Calcium: 9.1 mg/dL (ref 8.9–10.3)
Chloride: 103 mmol/L (ref 98–111)
Creatinine, Ser: 1.29 mg/dL — ABNORMAL HIGH (ref 0.44–1.00)
GFR, Estimated: 49 mL/min — ABNORMAL LOW (ref >60)
Glucose, Bld: 128 mg/dL — ABNORMAL HIGH (ref 70–99)
Potassium: 3.6 mmol/L (ref 3.5–5.1)
Sodium: 132 mmol/L — ABNORMAL LOW (ref 135–145)

## 2021-02-06 MED ORDER — ENSURE ENLIVE PO LIQD
237.0000 mL | Freq: Two times a day (BID) | ORAL | Status: DC
  Administered 2021-02-07: 14:00:00 237 mL via ORAL

## 2021-02-06 MED ORDER — ORAL CARE MOUTH RINSE
15.0000 mL | Freq: Two times a day (BID) | OROMUCOSAL | Status: DC
  Administered 2021-02-07: 13:00:00 15 mL via OROMUCOSAL

## 2021-02-06 MED ORDER — CHLORHEXIDINE GLUCONATE 0.12 % MT SOLN
15.0000 mL | Freq: Two times a day (BID) | OROMUCOSAL | Status: DC
  Administered 2021-02-06: 22:00:00 15 mL via OROMUCOSAL
  Filled 2021-02-06: qty 15

## 2021-02-06 MED ORDER — LACTATED RINGERS IV SOLN: INTRAVENOUS | Status: DC

## 2021-02-06 MED ORDER — ZOLPIDEM TARTRATE 5 MG PO TABS: 5.0000 mg | ORAL_TABLET | Freq: Every evening | ORAL | Status: DC | PRN

## 2021-02-06 MED ORDER — ACETAMINOPHEN 10 MG/ML IV SOLN
1000.0000 mg | Freq: Four times a day (QID) | INTRAVENOUS | Status: DC
  Administered 2021-02-07 (×3): 1000 mg via INTRAVENOUS
  Filled 2021-02-06 (×3): qty 100

## 2021-02-06 MED ORDER — OXYCODONE HCL 5 MG PO TABS
5.0000 mg | ORAL_TABLET | ORAL | Status: DC | PRN
  Administered 2021-02-06: 22:00:00 5 mg via ORAL
  Filled 2021-02-06: qty 1

## 2021-02-06 MED ORDER — SODIUM CHLORIDE 0.9 % IV SOLN
3.0000 g | Freq: Four times a day (QID) | INTRAVENOUS | Status: DC
  Administered 2021-02-06 – 2021-02-07 (×3): 3 g via INTRAVENOUS
  Filled 2021-02-06 (×5): qty 8

## 2021-02-06 MED ORDER — HYDROMORPHONE HCL 1 MG/ML IJ SOLN: 0.5000 mg | INTRAMUSCULAR | Status: DC | PRN

## 2021-02-06 NOTE — Plan of Care (Signed)
°  Problem: Education: Goal: Knowledge of General Education information will improve Description: Including pain rating scale, medication(s)/side effects and non-pharmacologic comfort measures Outcome: Progressing   Problem: Health Behavior/Discharge Planning: Goal: Ability to manage health-related needs will improve Outcome: Progressing   Problem: Clinical Measurements: Goal: Ability to maintain clinical measurements within normal limits will improve Outcome: Progressing Goal: Will remain free from infection Outcome: Progressing Goal: Diagnostic test results will improve Outcome: Progressing   Problem: Nutrition: Goal: Adequate nutrition will be maintained Outcome: Progressing   Problem: Pain Managment: Goal: General experience of comfort will improve Outcome: Progressing   Problem: Safety: Goal: Ability to remain free from injury will improve Outcome: Progressing

## 2021-02-06 NOTE — H&P (Signed)
55 year old female who is 1 month status post right partial nephrectomy. Her postoperative course has been complicated by a incarcerated piece of omentum in her midline incision that was creating some pain. This is subsequently resolved. However, over the past week she has developed pain over the right abdomen and flank that is radiating down anteriorly or on her thigh. She has had low-grade fevers. Her pain has been very difficult to control. She did have a CT scan that showed a small area of fluid in this area, but no other findings. There was some inflammation within the psoas, the right kidney was delayed in excretion. There was mild hydronephrosis.   Over the course of the last 5 days the patient's symptoms have not progressed, but have not improved. She has been on antibiotics, and denies any fevers. She denies any voiding symptoms.   Interval: The patient underwent a right ureteral stent on Thursday. She subsequently has done reasonably well, with no fevers or ongoing flank pain. She is having some right sided abdominal pain. The pain radiating down her leg has gotten better. However today, she seems to be having worse pain.     ALLERGIES: No Allergies    MEDICATIONS: Atorvastatin Calcium 10 mg tablet  Buspirone Hcl 5 mg tablet  Escitalopram Oxalate 10 mg tablet  Folic Acid 1 mg tablet  Hydrocodone-Acetaminophen 5 mg-325 mg tablet 1-2 tablet PO Q 6 H PRN  Levothyroxine 25 mcg capsule  Linzess 145 mcg capsule  Losartan-Hydrochlorothiazide 100 mg-25 mg tablet  Ondansetron Hcl 4 mg tablet  Oxycodone Hcl 5 mg tablet 1-2 tablet PO Q 6 H PRN severe post nephrectomy pain  Oxycodone Hcl 5 mg tablet  Potassium 99 mg capsule  Potassium Chloride 20 meq tablet, extended release  Vitamin B12  Vitamin C  Vitamin D  Vitamin E  Zinc     GU PSH: Partial nephrectomy (laparoscopic) - 12/28/2020       PSH Notes: Breast reduction (12/2019), bowel resection (08/2014), right knee meniscus (11/2017),  right ovary removed (10/2002), partial thyroidectomy (2006), Breast reduction (10/08/2020)   NON-GU PSH: Carpal tunnel surgery, 08/2011     GU PMH: Benign tumor right kidney - 01/31/2021, - 10/09/2020 Hydronephrosis - 01/31/2021 Benign Neo Kidney, Unspec - 01/30/2021, - 01/28/2021, - 01/14/2021, - 12/18/2020 Ischemia and infarction of kidney - 01/28/2021      PMH Notes: Crohns   NON-GU PMH: Anxiety Arthritis Depression Hypercholesterolemia Hypertension Hypothyroidism Sleep Apnea    FAMILY HISTORY: Atrial Fibrillation - Father, Brother Breast Cancer - Runs in Family Glaucoma - Father nephrolithiasis - Brother   SOCIAL HISTORY: Marital Status: Unknown Preferred Language: English; Ethnicity: Not Hispanic Or Latino; Race: White Current Smoking Status: Patient does not smoke anymore.   Tobacco Use Assessment Completed: Used Tobacco in last 30 days? Does not use smokeless tobacco. Social Drinker.  Does not use drugs. Drinks 2 caffeinated drinks per day. Has not had a blood transfusion.     Notes: quit smoking 10 years ago 1/2 pack per week  ETOH beer twice per week    REVIEW OF SYSTEMS:    GU Review Female:   Patient denies frequent urination, hard to postpone urination, burning /pain with urination, get up at night to urinate, leakage of urine, stream starts and stops, trouble starting your stream, have to strain to urinate, and being pregnant.  Gastrointestinal (Upper):   Patient denies nausea, vomiting, and indigestion/ heartburn.  Gastrointestinal (Lower):   Patient denies diarrhea and constipation.  Constitutional:   Patient  denies fever, night sweats, weight loss, and fatigue.  Skin:   Patient denies skin rash/ lesion and itching.  Eyes:   Patient denies blurred vision and double vision.  Ears/ Nose/ Throat:   Patient denies sore throat and sinus problems.  Hematologic/Lymphatic:   Patient denies swollen glands and easy bruising.  Cardiovascular:   Patient denies leg  swelling and chest pains.  Respiratory:   Patient denies cough and shortness of breath.  Endocrine:   Patient denies excessive thirst.  Musculoskeletal:   Patient denies back pain and joint pain.  Neurological:   Patient denies headaches and dizziness.  Psychologic:   Patient denies depression and anxiety.   VITAL SIGNS:      02/06/2021 01:12 PM  Weight 150 lb / 68.04 kg  Height 65 in / 165.1 cm  BP 121/81 mmHg  Pulse 101 /min  Temperature 97.1 F / 36.1 C  BMI 25.0 kg/m   Complexity of Data:  Source Of History:  Patient  Records Review:   Previous Doctor Records, Previous Patient Records, POC Tool  Urine Test Review:   Urinalysis   PROCEDURES:         C.T. Hematuria - [id number], [phone]  The patient has an organized fluid collection in the right psoas muscle adjacent to the area where the patient had a ureteral injury.      OMNIPAQUE 300 125CC Patient confirmed No Neulasta OnPro Device.           Urinalysis w/Scope Dipstick Dipstick Cont'd Micro  Color: Yellow Bilirubin: Neg mg/dL WBC/hpf: 0 - 5/hpf  Appearance: Clear Ketones: Neg mg/dL RBC/hpf: 40 - 60/hpf  Specific Gravity: 1.025 Blood: 3+ ery/uL Bacteria: Mod (26-50/hpf)  pH: 5.5 Protein: 1+ mg/dL Cystals: NS (Not Seen)  Glucose: Neg mg/dL Urobilinogen: 0.2 mg/dL Casts: NS (Not Seen)    Nitrites: Neg Trichomonas: Not Present    Leukocyte Esterase: 1+ leu/uL Mucous: Not Present      Epithelial Cells: 10 - 20/hpf      Yeast: NS (Not Seen)      Sperm: Not Present    Notes: microscopic not concentrated    ASSESSMENT:      ICD-10 Details  1 GU:   Benign tumor right kidney - D30.01   2   Hydronephrosis - N13.0      PLAN:           Orders X-Rays: C.T. Hematuria With and Without I.V. Contrast  X-Ray Notes: History:  Hematuria: Yes/No  Patient to see MD after exam: Yes/No  Previous exam: CT / IVP/ US/ KUB/ None  When:  Where:  Diabetic: Yes/ No  BUN/ Creatinine:  Date of last BUN Creatinine:  Weight in  pounds:  Allergy- IV Contrast: Yes/ No  Conflicting diabetic meds: Yes/ No  Diabetic Meds:  Prior Authorization #: [id number]            Schedule         Document Letter(s):  Created for Patient: Clinical Summary         Notes:   The patient has an organized fluid collection in the right psoas. The plan is to admit the patient, and then have IR place a drain in this region. We will put her on broad-spectrum antibiotics initially, and await the results of the aspiration.   Would ask that interventional radiology send the fluid for gram stain, anaerobic and aerobic culture. Once she undergoes the procedure she may be able to be discharged home.

## 2021-02-06 NOTE — Progress Notes (Signed)
Direct admit arrived on the floor. MD notified of pts arrival.

## 2021-02-07 ENCOUNTER — Observation Stay (HOSPITAL_COMMUNITY): Payer: PPO

## 2021-02-07 ENCOUNTER — Encounter (HOSPITAL_COMMUNITY): Payer: Self-pay | Admitting: Urology

## 2021-02-07 DIAGNOSIS — D3001 Benign neoplasm of right kidney: Secondary | ICD-10-CM | POA: Diagnosis not present

## 2021-02-07 MED ORDER — AMOXICILLIN-POT CLAVULANATE 875-125 MG PO TABS: 1.0000 | ORAL_TABLET | Freq: Two times a day (BID) | ORAL | 0 refills | Status: AC

## 2021-02-07 MED ORDER — SODIUM CHLORIDE 0.9% FLUSH: 5.0000 mL | Freq: Three times a day (TID) | INTRAVENOUS | Status: DC

## 2021-02-07 MED ORDER — MIDAZOLAM HCL 2 MG/2ML IJ SOLN
INTRAMUSCULAR | Status: AC | PRN
  Administered 2021-02-07 (×4): 1 mg via INTRAVENOUS

## 2021-02-07 MED ORDER — FENTANYL CITRATE (PF) 100 MCG/2ML IJ SOLN
INTRAMUSCULAR | Status: AC | PRN
  Administered 2021-02-07 (×2): 50 ug via INTRAVENOUS

## 2021-02-07 MED ORDER — MIDAZOLAM HCL 2 MG/2ML IJ SOLN
INTRAMUSCULAR | Status: AC
  Filled 2021-02-07: qty 4

## 2021-02-07 MED ORDER — FENTANYL CITRATE (PF) 100 MCG/2ML IJ SOLN
INTRAMUSCULAR | Status: AC
  Filled 2021-02-07: qty 2

## 2021-02-07 NOTE — Discharge Summary (Signed)
Date of admission: 02/06/2021  Date of discharge: 02/07/2021  Admission diagnosis: Psoas fluid collection   Discharge diagnosis: Psoas fluid collection   Secondary diagnoses:  Patient Active Problem List   Diagnosis Date Noted   Psoas abscess, right ([place]) 02/06/2021   Hydronephrosis due to obstruction of ureter 01/31/2021   Renal mass 12/28/2020    Procedures performed:   History and Physical: For full details, please see admission history and physical. Briefly, Rhonda Wilkerson is a 55 y.o. year old 1 month s/p right partial nephrectomy. Her postoperative course has been complicated by a incarcerated piece of omentum in her midline incision that was creating some pain. This is subsequently resolved. She then had persistent pain along her right leg concerning for psoas irritation so CT imaging was obtained which showed fluid collection at psoas concerning for urine leak. She was subsequently stented on 12/15. She presented to clinic 12/21 with continued pain and repeat CT showed large psoas fluid collection. She was admitted for IR drain placement into the collection.  Hospital Course: Patient was admitted on 12/21 and started on IV fluids and Unasyn to cover for possible psoas abscess. IR was consulted for drain placement into psoas collection. This was done on 86/57 without complications. A 44fr drain was placed with purulent output. A culture was sent. Initial drain output was ~40cc. Patient tolerated the procedure well and had no fevers or hemodynamic instability after this. She was discharged home with drain in place on a 10 day course of augmentin.    Laboratory values:  Recent Labs    02/06/21 2145  WBC 22.5*  HGB 10.7*  HCT 34.3*   Recent Labs    02/06/21 2145  NA 132*  K 3.6  CL 103  CO2 21*  GLUCOSE 128*  BUN 29*  CREATININE 1.29*  CALCIUM 9.1   No results for input(s): LABPT, INR in the last 72 hours. No results for input(s): LABURIN in the last 72  hours. Results for orders placed or performed during the hospital encounter of 01/31/21  Urine Culture     Status: None   Collection Time: 01/31/21  6:47 PM   Specimen: Urine, Cystoscope  Result Value Ref Range Status   Specimen Description   Final    KIDNEY URINE FROM RIGHT KIDNEY Performed at [facility] [address], [place], [place] [address]    Special Requests   Final    NONE Performed at [facility], [place] [address] [place], [place] [address]    Culture   Final    NO GROWTH Performed at [facility], [place] [address] [place], [place] [address]    Report Status 02/01/2021 FINAL  Final  Gram stain     Status: None   Collection Time: 01/31/21  6:47 PM   Specimen: Urine, Cystoscope  Result Value Ref Range Status   Specimen Description   Final    URINE, RANDOM CYSTOSCOPE Performed at [facility], [place] [address], [place], [place] [address]    Special Requests   Final    NONE Performed at [facility], [place] [address], [place], [place] [address]    Gram Stain   Final    WBC PRESENT,BOTH PMN AND MONONUCLEAR GRAM NEGATIVE RODS GRAM POSITIVE RODS CYTOSPIN SMEAR Performed at [facility], [place] [address] [place], [place] [address]    Report Status 01/31/2021 FINAL  Final  Anaerobic culture w Gram Stain  Status: None   Collection Time: 01/31/21  6:47 PM   Specimen: Urine, Cystoscope  Result Value Ref Range Status   Specimen Description   Final    URINE, RANDOM CYCTOSCOPE Performed at [facility], [place] [address], [place], [place] [address]    Special Requests   Final    NONE Performed at [facility], [place] [address] [place], [place] [address]    Gram Stain   Final    WBC PRESENT,BOTH PMN AND MONONUCLEAR GRAM NEGATIVE RODS GRAM POSITIVE RODS CYTOSPIN SMEAR    Culture   Final    FEW LACTOBACILLUS SPECIES Standardized susceptibility  testing for this organism is not available. NO ANAEROBES ISOLATED Performed at [facility], [place] [address] [place], [place] [address]    Report Status 02/03/2021 FINAL  Final    Disposition: Home  Discharge instruction: The patient was instructed to be ambulatory but told to refrain from heavy lifting, strenuous activity, or driving.   Discharge medications:  Allergies as of 02/07/2021   No Known Allergies      Medication List     TAKE these medications    amoxicillin-clavulanate 875-125 MG tablet Commonly known as: Augmentin Take 1 tablet by mouth every 12 (twelve) hours for 10 days.   atorvastatin 10 MG tablet Commonly known as: LIPITOR Take 10 mg by mouth daily.   busPIRone 5 MG tablet Commonly known as: BUSPAR Take 5 mg by mouth 2 (two) times daily as needed.   escitalopram 10 MG tablet Commonly known as: LEXAPRO Take 10 mg by mouth daily.   folic acid 1 MG tablet Commonly known as: FOLVITE Take 1 mg by mouth daily.   levothyroxine 25 MCG tablet Commonly known as: SYNTHROID Take 25 mcg by mouth daily before breakfast.   linaclotide 145 MCG Caps capsule Commonly known as: LINZESS Take 145 mcg by mouth daily.   losartan-hydrochlorothiazide 100-25 MG tablet Commonly known as: HYZAAR Take 1 tablet by mouth daily.   ondansetron 4 MG tablet Commonly known as: ZOFRAN Take 1 tablet (4 mg total) by mouth every 6 (six) hours as needed for nausea or vomiting.   oxyCODONE 5 MG immediate release tablet Commonly known as: Roxicodone Take 1-2 tablets (5-10 mg total) by mouth every 4 (four) hours as needed for severe pain.   phenazopyridine 200 MG tablet Commonly known as: Pyridium Take 1 tablet (200 mg total) by mouth 3 (three) times daily as needed for pain.   potassium chloride SA 20 MEQ tablet Commonly known as: KLOR-CON M Take 1 tablet (20 mEq total) by mouth 2 (two) times daily.   Stelara 90 MG/ML Sosy injection Generic drug:  ustekinumab Inject 90 mg into the skin every 8 (eight) weeks.   valACYclovir 1000 MG tablet Commonly known as: VALTREX Take 1,000 mg by mouth daily as needed (outbreak).   Vitamin B 12 500 MCG Tabs Take 2 tablets by mouth daily.   vitamin C 1000 MG tablet Take 1,000 mg by mouth daily.   Vitamin D3 125 MCG (5000 UT) Caps Take 5,000 Units by mouth daily.   vitamin E 180 MG (400 UNITS) capsule Take 400 Units by mouth daily.   zinc gluconate 50 MG tablet Take 50 mg by mouth daily.        Followup:   Follow-up Information     [facility] Follow up.   Why: Follow up apppointment needed in 7 days. A scheduler from our office will call  you with a date/time of your appointment. Flush the drain daily with 5 ml NS, keep the site clean and dry. Document the daily output. Please call our office prior to your visit with any questions or if drain output is minimal. Contact information: [place] [address] [phone]         Ardis Hughs, MD. Schedule an appointment as soon as possible for a visit in 2 week(s).   Specialty: Urology Contact information: [place] [address] [phone]

## 2021-02-07 NOTE — Progress Notes (Signed)
°  Subjective: Feeling better this morning with fluids overnight No fevers or chills Stable RLE pain   Objective: Vital signs in last 24 hours: Temp:  [97.9 F (36.6 C)-98.4 F (36.9 C)] 97.9 F (36.6 C) (12/22 0503) Pulse Rate:  [62-79] 62 (12/22 0503) Resp:  [18-21] 19 (12/22 0503) BP: (125-134)/(61-75) 133/75 (12/22 0503) SpO2:  [95 %-97 %] 95 % (12/22 0503) Weight:  [67.6 kg] 67.6 kg (12/21 2056)  Intake/Output from previous day: 12/21 0701 - 12/22 0700 In: 1448.1 [P.O.:240; I.V.:908.1; IV Piggyback:300] Out: -  Intake/Output this shift: No intake/output data recorded.  Physical Exam:  General: Alert and oriented CV: RRR Lungs: Clear Abdomen: Soft, ND Ext: NT, No erythema  Lab Results: Recent Labs    02/06/21 2145  HGB 10.7*  HCT 34.3*   BMET Recent Labs    02/06/21 2145  NA 132*  K 3.6  CL 103  CO2 21*  GLUCOSE 128*  BUN 29*  CREATININE 1.29*  CALCIUM 9.1     Studies/Results: No results found.  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 She is s/p right ureteral stent 12/15.   -Plan for IR drain placement into urinoma. Will send fluid for culture  -NPO until procedure with mIVF -Unasyn for empiric coverage given leukocytosis to 22K, reassuringly patient has not been clinically infected and no gas on imaging to suggest abscess  -IV tylenol, prn oxycodone, dilaudid for break through for pain control  -Likely discharge after drain placement pending timing    LOS: 1 day   Chester Romero 02/07/2021, 7:57 AM

## 2021-02-07 NOTE — Progress Notes (Signed)
Pt to be discharged to home this evening. Pt and Pt's family given discharge teaching including all discharge Medications and the schedules for these Medications. Pt and Family also given home drain care and how to flush the drain. Understanding verbalized of all discharge teaching/instructions. Discharge AVS with Pt and family at discharge.

## 2021-02-07 NOTE — Consult Note (Signed)
Chief Complaint: Patient was seen in consultation today for right psoas fluid collection aspiration with possible drain placement.   Referring Physician(s): Dr. Louis Meckel  Supervising Physician: Dr. Serafina Royals  Patient Status: [facility] - In-pt  History of Present Illness: Rhonda Wilkerson is a 55 y.o. female with a medical history significant for anxiety/depression, Crohn's disease (bowel resection 2016) and a benign kidney tumor, s/p right partial nephrectomy 12/28/20. She presented to the ED 01/25/21 with RLQ pain, chills, and fever. Work up showed a small organizing fluid collection in the right psoas and there was concern for possible pyelonephritis - she was discharged home on antibiotics. Her symptoms continued to worsen and and was taken back to the OR 01/31/21 for evaluation of a possible urine leak. She underwent cystoscopy, right ureteral stent placement, right retrograde pyelography and right ureteroscopy. She was discharged home but continued to experience significant pain in the RLQ/Right thigh area. She was direct admitted to [facility] 02/06/21.    CT Abdomen/Pelvis with contrast 02/06/21 IMPRESSION: 1. Interval placement of right-sided double-J ureteral stent, with formed pigtails in the right renal pelvis and urinary bladder. There has been relief of previously seen right hydronephrosis. 2. There has been significant interval enlargement of a rim enhancing, multilocular fluid collection overlying the right psoas musculature, now with significantly greater inferior extent tracking along the psoas and adjacent pericolic gutter, overall dimensions at least 12.9 x 5.3 x 3.2 cm. Findings are consistent with worsened abscess. 3. Areas of scarred or infarcted right renal cortex are unchanged. 4. Postoperative findings of multiple bowel resections. 5. Relatively diffuse mucosal hyperenhancement involving the colon, in keeping with inflammatory bowel disease and suggesting  active inflammation. 6. Cholelithiasis.  Interventional Radiology has been consulted by Dr. Louis Meckel with the Urology team to evaluate this patient for an image-guided fluid collection aspiration with possible drain placement. Imaging reviewed and procedure approved by Dr. Serafina Royals.   Past Medical History:  Diagnosis Date   Anxiety    Arthritis    Crohn's disease ([place])    Depression    Hypertension    Hypothyroid    Sleep apnea    uses cpap    Past Surgical History:  Procedure Laterality Date   BOWEL RESECTION     BREAST REDUCTION SURGERY Bilateral 01/06/2020   Procedure: MAMMARY REDUCTION  (BREAST);  Surgeon: Irene Limbo, MD;  Location: [place];  Service: Plastics;  Laterality: Bilateral;   BREAST SURGERY     CARPAL TUNNEL RELEASE     KNEE ARTHROSCOPY Right    OOPHORECTOMY Right    ovary     ROBOTIC ASSITED PARTIAL NEPHRECTOMY Right 12/28/2020   Procedure: XI ROBOTIC ASSITED RIGHT PARTIAL NEPHRECTOMY;  Surgeon: Ardis Hughs, MD;  Location: [facility];  Service: Urology;  Laterality: Right;   SCAR REVISION Bilateral 10/08/2020   Procedure: SCAR REVISION BILATERAL BREASTS;  Surgeon: Irene Limbo, MD;  Location: [place];  Service: Plastics;  Laterality: Bilateral;   THYROIDECTOMY, PARTIAL     URETEROSCOPY Right 01/31/2021   Procedure: CYSTOSCOPY/URETEROSCOPY/PYELOGRAM/RETROGRADE/STENT PLACEMENT;  Surgeon: Ardis Hughs, MD;  Location: [facility];  Service: Urology;  Laterality: Right;    Allergies: Patient has no known allergies.  Medications: Prior to Admission medications   Medication Sig Start Date End Date Taking? Authorizing Provider  Ascorbic Acid (VITAMIN C) 1000 MG tablet Take 1,000 mg by mouth daily.   Yes [provider]  atorvastatin (LIPITOR) 10 MG tablet Take 10 mg by mouth daily.  Yes [provider]  busPIRone (BUSPAR) 5 MG tablet Take 5 mg by mouth 2 (two) times daily as needed.   Yes  [provider]  Cholecalciferol (VITAMIN D3) 125 MCG (5000 UT) CAPS Take 5,000 Units by mouth daily.   Yes [provider]  Cyanocobalamin (VITAMIN B 12) 500 MCG TABS Take 2 tablets by mouth daily.   Yes [provider]  escitalopram (LEXAPRO) 10 MG tablet Take 10 mg by mouth daily.   Yes [provider]  folic acid (FOLVITE) 1 MG tablet Take 1 mg by mouth daily.   Yes [provider]  levothyroxine (SYNTHROID) 25 MCG tablet Take 25 mcg by mouth daily before breakfast.   Yes [provider]  linaclotide (LINZESS) 145 MCG CAPS capsule Take 145 mcg by mouth daily.   Yes [provider]  losartan-hydrochlorothiazide (HYZAAR) 100-25 MG tablet Take 1 tablet by mouth daily.   Yes [provider]  ondansetron (ZOFRAN) 4 MG tablet Take 1 tablet (4 mg total) by mouth every 6 (six) hours as needed for nausea or vomiting. 29/93/71  Yes Delora Fuel, MD  oxyCODONE (ROXICODONE) 5 MG immediate release tablet Take 1-2 tablets (5-10 mg total) by mouth every 4 (four) hours as needed for severe pain. 69/67/89  Yes Delora Fuel, MD  potassium chloride SA (KLOR-CON M) 20 MEQ tablet Take 1 tablet (20 mEq total) by mouth 2 (two) times daily. 38/10/17  Yes Delora Fuel, MD  ustekinumab (STELARA) 90 MG/ML SOSY injection Inject 90 mg into the skin every 8 (eight) weeks. 10/18/16  Yes [provider]  valACYclovir (VALTREX) 1000 MG tablet Take 1,000 mg by mouth daily as needed (outbreak).   Yes [provider]  vitamin E 180 MG (400 UNITS) capsule Take 400 Units by mouth daily.   Yes [provider]  zinc gluconate 50 MG tablet Take 50 mg by mouth daily.   Yes [provider]  phenazopyridine (PYRIDIUM) 200 MG tablet Take 1 tablet (200 mg total) by mouth 3 (three) times daily as needed for pain. Patient not taking: Reported on 02/06/2021 01/31/21   Ardis Hughs, MD     Family History  Problem Relation Age of  Onset   Hyperlipidemia Mother    Hypertension Mother    Hyperlipidemia Father    Hypertension Father    Atrial fibrillation Father     Social History   Socioeconomic History   Marital status: Soil scientist    Spouse name: Not on file   Number of children: Not on file   Years of education: Not on file   Highest education level: Not on file  Occupational History   Not on file  Tobacco Use   Smoking status: Former    Types: Cigarettes   Smokeless tobacco: Never  Vaping Use   Vaping Use: Never used  Substance and Sexual Activity   Alcohol use: Yes    Comment: Socially    Drug use: Never   Sexual activity: Not on file  Other Topics Concern   Not on file  Social History Narrative   Not on file   Social Determinants of Health   Financial Resource Strain: Not on file  Food Insecurity: Not on file  Transportation Needs: Not on file  Physical Activity: Not on file  Stress: Not on file  Social Connections: Not on file    Review of Systems: A 12 point ROS discussed and pertinent positives are indicated in the HPI above.  All  other systems are negative.  Review of Systems  Constitutional:  Positive for appetite change and fatigue.  Respiratory:  Negative for cough and shortness of breath.   Cardiovascular:  Negative for chest pain and leg swelling.  Gastrointestinal:  Positive for nausea. Negative for vomiting.  Genitourinary:  Negative for difficulty urinating.  Neurological:  Negative for dizziness and headaches.   Vital Signs: BP 134/70 (BP Location: Right Arm)    Pulse 64    Temp 98.3 F (36.8 C) (Oral)    Resp 18    Ht 5\' 5"  (1.651 m)    Wt 149 lb 0.5 oz (67.6 kg)    SpO2 96%    BMI 24.80 kg/m   Physical Exam Constitutional:      General: She is not in acute distress.    Appearance: She is not ill-appearing.  HENT:     Mouth/Throat:     Mouth: Mucous membranes are moist.     Pharynx: Oropharynx is clear.  Cardiovascular:     Rate and Rhythm: Normal rate  and regular rhythm.     Pulses: Normal pulses.     Heart sounds: Normal heart sounds.  Pulmonary:     Effort: Pulmonary effort is normal.     Breath sounds: Normal breath sounds.  Abdominal:     General: Bowel sounds are normal.     Palpations: Abdomen is soft.     Tenderness: There is abdominal tenderness.     Comments: RLQ tenderness. Laparoscopic incisions from prior surgery are well-approximated and remarkable. Patient has a supra-umbilical seroma that is firm but non-tender.   Musculoskeletal:        General: Normal range of motion.     Right lower leg: No edema.     Left lower leg: No edema.  Skin:    General: Skin is warm and dry.  Neurological:     Mental Status: She is alert and oriented to person, place, and time.  Psychiatric:        Mood and Affect: Mood normal.        Behavior: Behavior normal.        Thought Content: Thought content normal.        Judgment: Judgment normal.    Imaging: CT ABDOMEN PELVIS W CONTRAST  Result Date: 01/25/2021 CLINICAL DATA:  Right lower quadrant abdominal pain. EXAM: CT ABDOMEN AND PELVIS WITH CONTRAST TECHNIQUE: Multidetector CT imaging of the abdomen and pelvis was performed using the standard protocol following bolus administration of intravenous contrast. CONTRAST:  74mL OMNIPAQUE IOHEXOL 350 MG/ML SOLN COMPARISON:  CT abdomen pelvis dated 01/14/2021. FINDINGS: Lower chest: The visualized lung bases are clear. No intra-abdominal free air or free fluid. Hepatobiliary: The liver is unremarkable. No intrahepatic biliary dilatation. Multiple stones within the gallbladder. No pericholecystic fluid or evidence of acute cholecystitis by CT. Pancreas: Unremarkable. No pancreatic ductal dilatation or surrounding inflammatory changes. Spleen: Normal in size without focal abnormality. Adrenals/Urinary Tract: The adrenal glands are unremarkable. Similar degree of right hydronephrosis with transition at the right ureteropelvic junction. No obstructing  stone identified. Underlying urothelial neoplasm or stricture is not excluded. Areas of decreased enhancement primarily involving the lower pole of the right kidney likely represents a developing infarct. Pyelonephritis is not excluded correlation with urinalysis recommended. There is inflammatory changes surrounding the right ureter with edema extending into the right psoas muscle. There is a 1.9 x 1.4 cm fluid along the anterior right psoas muscle (48/2) which may represent edema or developing abscess. There  is delayed excretion of contrast by the right kidney. The left kidney, left ureter, and urinary bladder appear unremarkable. Stomach/Bowel: There is postsurgical changes of the bowel with several anastomotic sutures. There is no bowel obstruction. The appendix is not identified with certainty, likely surgically absent. Vascular/Lymphatic: Mild aortoiliac atherosclerotic disease. The IVC is unremarkable. No portal venous gas. There is no adenopathy. Reproductive: The uterus is anteverted.  No adnexal masses. Other: Midline vertical anterior abdominal wall incisional scar. Musculoskeletal: Mild degenerative changes. No acute osseous pathology. IMPRESSION: 1. Similar degree of right hydronephrosis with transition at the right ureteropelvic junction. Interval development of areas of infarct involving the lower pole of the right kidney. 2. Right periureteric inflammatory changes with possible developing loculated collection or abscess along the right psoas muscle. No drainable fluid collection at this time. 3. Cholelithiasis. 4. Postsurgical changes of the bowel. No bowel obstruction. 5. Aortic Atherosclerosis (ICD10-I70.0). Electronically Signed   By: Anner Crete M.D.   On: 01/25/2021 20:41   DG C-Arm 1-60 Min-No Report  Result Date: 01/31/2021 Fluoroscopy was utilized by the requesting physician.  No radiographic interpretation.    Labs:  CBC: Recent Labs    12/28/20 1200 12/29/20 0525  01/25/21 1922 02/06/21 2145  WBC 16.9* 11.4* 13.6* 22.5*  HGB 12.3 12.2 12.4 10.7*  HCT 38.6 36.7 37.7 34.3*  PLT 149* 262 279 403*    COAGS: No results for input(s): INR, APTT in the last 8760 hours.  BMP: Recent Labs    12/28/20 1200 12/29/20 0525 01/25/21 1922 02/06/21 2145  NA 138 133* 134* 132*  K 3.1* 2.8* 3.2* 3.6  CL 103 99 96* 103  CO2 22 25 26  21*  GLUCOSE 160* 125* 232* 128*  BUN 10 9 21* 29*  CALCIUM 8.4* 8.3* 9.4 9.1  CREATININE 0.90 0.83 1.09* 1.29*  GFRNONAA >60 >60 60* 49*    LIVER FUNCTION TESTS: Recent Labs    12/25/20 1115 01/25/21 1922  BILITOT 1.3* 1.6*  AST 21 23  ALT 19 11  ALKPHOS 62 73  PROT 7.3 7.5  ALBUMIN 4.1 4.0    TUMOR MARKERS: No results for input(s): AFPTM, CEA, CA199, CHROMGRNA in the last 8760 hours.  Assessment and Plan:  Benign kidney tumor s/p right partial nephrectomy; post-op urinoma with large fluid collection: Coralee Pesa, 55 year old female, is scheduled this afternoon for an image-guided fluid collection aspiration with possible drain placement.   Risks and benefits discussed with the patient including bleeding, infection, damage to adjacent structures, bowel perforation/fistula connection, and sepsis.  All of the patient's questions were answered, patient is agreeable to proceed. She has been NPO.   Consent signed and in chart.  Thank you for this interesting consult.  I greatly enjoyed meeting Laurelin Elson and look forward to participating in their care.  A copy of this report was sent to the requesting provider on this date.  Electronically Signed: Soyla Dryer, AGACNP-BC [phone] 02/07/2021, 9:36 AM   I spent a total of 20 Minutes    in face to face in clinical consultation, greater than 50% of which was counseling/coordinating care for urinoma aspiration with possible drain placement.

## 2021-02-07 NOTE — Progress Notes (Signed)
Urology Inpatient Progress Report  Psoas abscess, right ([place]) [K68.12]   Intv/Subj: No acute events overnight. Patient is without complaint. NPO p MN.  Principal Problem:   Psoas abscess, right ([place])  Current Facility-Administered Medications  Medication Dose Route Frequency Provider Last Rate Last Admin   acetaminophen (OFIRMEV) IV 1,000 mg  1,000 mg Intravenous Q6H Ardis Hughs, MD 400 mL/hr at 02/07/21 0600 1,000 mg at 02/07/21 0600   Ampicillin-Sulbactam (UNASYN) 3 g in sodium chloride 0.9 % 100 mL IVPB  3 g Intravenous Q6H Aldine Contes, MD 200 mL/hr at 02/07/21 0619 3 g at 02/07/21 0619   chlorhexidine (PERIDEX) 0.12 % solution 15 mL  15 mL Mouth Rinse BID Ardis Hughs, MD   15 mL at 02/06/21 2200   feeding supplement (ENSURE ENLIVE / ENSURE PLUS) liquid 237 mL  237 mL Oral BID BM Ardis Hughs, MD       HYDROmorphone (DILAUDID) injection 0.5 mg  0.5 mg Intravenous Q2H PRN Aldine Contes, MD       lactated ringers infusion   Intravenous Continuous Ardis Hughs, MD 125 mL/hr at 02/07/21 0616 New Bag at 02/07/21 0616   MEDLINE mouth rinse  15 mL Mouth Rinse q12n4p Ardis Hughs, MD       oxyCODONE (Oxy IR/ROXICODONE) immediate release tablet 5-10 mg  5-10 mg Oral Q4H PRN Aldine Contes, MD   5 mg at 02/06/21 2159   zolpidem (AMBIEN) tablet 5 mg  5 mg Oral QHS PRN Aldine Contes, MD         Objective: Vital: Vitals:   02/06/21 2054 02/06/21 2056 02/07/21 0121 02/07/21 0503  BP:  125/74 134/61 133/75  Pulse: 79 78 69 62  Resp:  (!) 21 18 19   Temp: 98.4 F (36.9 C) 98.3 F (36.8 C) 98.2 F (36.8 C) 97.9 F (36.6 C)  TempSrc: Oral Oral Oral Oral  SpO2: 97% 95% 95% 95%  Weight:  67.6 kg    Height:  5\' 5"  (1.651 m)     I/Os: I/O last 3 completed shifts: In: 1448.1 [P.O.:240; I.V.:908.1; IV Piggyback:300] Out: -   Physical Exam:  General: Patient is in no apparent distress Lungs: Normal respiratory effort, chest expands symmetrically. GI: The  abdomen is soft and nontender without mass. Ext: lower extremities symmetric  Lab Results: Recent Labs    02/06/21 2145  WBC 22.5*  HGB 10.7*  HCT 34.3*   Recent Labs    02/06/21 2145  NA 132*  K 3.6  CL 103  CO2 21*  GLUCOSE 128*  BUN 29*  CREATININE 1.29*  CALCIUM 9.1   No results for input(s): LABPT, INR in the last 72 hours. No results for input(s): LABURIN in the last 72 hours. Results for orders placed or performed during the hospital encounter of 01/31/21  Urine Culture     Status: None   Collection Time: 01/31/21  6:47 PM   Specimen: Urine, Cystoscope  Result Value Ref Range Status   Specimen Description   Final    KIDNEY URINE FROM RIGHT KIDNEY Performed at [place] [address] [place], [place] [address]    Special Requests   Final    NONE Performed at [facility] [place] [address], [place], [place] [address]    Culture   Final    NO GROWTH Performed at [facility], [place] [address], [place],  [address]    Report Status 02/01/2021 FINAL  Final  Gram  stain     Status: None   Collection Time: 01/31/21  6:47 PM   Specimen: Urine, Cystoscope  Result Value Ref Range Status   Specimen Description   Final    URINE, RANDOM CYSTOSCOPE Performed at [facility], [place] [address] [place], [place] [address]    Special Requests   Final    NONE Performed at [facility], [place] [address] [place], [place] [address]    Gram Stain   Final    WBC PRESENT,BOTH PMN AND MONONUCLEAR GRAM NEGATIVE RODS GRAM POSITIVE RODS CYTOSPIN SMEAR Performed at [facility], [place] [address] [place], [place] [address]    Report Status 01/31/2021 FINAL  Final  Anaerobic culture w Gram Stain     Status: None   Collection Time: 01/31/21  6:47 PM   Specimen: Urine, Cystoscope  Result Value Ref Range Status   Specimen Description   Final    URINE, RANDOM CYCTOSCOPE Performed at  [facility], [place] [address], [place], [place] [address]    Special Requests   Final    NONE Performed at [facility], [place] [address], [place], [place] [address]    Gram Stain   Final    WBC PRESENT,BOTH PMN AND MONONUCLEAR GRAM NEGATIVE RODS GRAM POSITIVE RODS CYTOSPIN SMEAR    Culture   Final    FEW LACTOBACILLUS SPECIES Standardized susceptibility testing for this organism is not available. NO ANAEROBES ISOLATED Performed at [facility], [place] [address] [place], [place] [address]    Report Status 02/03/2021 FINAL  Final    Studies/Results: No results found.  Assessment: Right psoas urinoma, with severe irritation and associated pain.  Ureteral injury from right partial nephrectomy, stented.  CT scan demonstrates no on-going or active leak.    Plan: Discussed with Dr. Serafina Royals, from IR, and will plan to place drain into the right psoas fluid collection.  Will keep patient on ABX until gram stain/cultures return.  Expect patient to feel better with much less pain once drain is placed.  Will re-evaluate after she covers from procedure for discharge this PM or tomorrow AM.   Louis Meckel, MD Urology 02/07/2021, 7:54 AM

## 2021-02-07 NOTE — Procedures (Signed)
Interventional Radiology Procedure Note  Procedure:  Ct drain rt psoas abscess    Complications: None  Estimated Blood Loss:  min  Findings: 40cc bloody pus aspirated, cx sent    Tamera Punt, MD

## 2021-02-12 ENCOUNTER — Other Ambulatory Visit: Payer: Self-pay | Admitting: Urology

## 2021-02-12 DIAGNOSIS — K6812 Psoas muscle abscess: Secondary | ICD-10-CM

## 2021-02-12 LAB — AEROBIC/ANAEROBIC CULTURE W GRAM STAIN (SURGICAL/DEEP WOUND)
Culture: NO GROWTH
Special Requests: NORMAL

## 2021-02-15 ENCOUNTER — Other Ambulatory Visit: Payer: Self-pay

## 2021-02-15 ENCOUNTER — Other Ambulatory Visit (HOSPITAL_COMMUNITY): Payer: Self-pay | Admitting: Student

## 2021-02-15 ENCOUNTER — Ambulatory Visit (HOSPITAL_COMMUNITY)
Admission: RE | Admit: 2021-02-15 | Discharge: 2021-02-15 | Disposition: A | Discharge status: Home / Self Care | Payer: PPO | Source: Ambulatory Visit | Attending: Student | Admitting: Student

## 2021-02-15 ENCOUNTER — Ambulatory Visit (HOSPITAL_COMMUNITY)
Admission: RE | Admit: 2021-02-15 | Discharge: 2021-02-15 | Disposition: A | Discharge status: Home / Self Care | Payer: PPO | Source: Ambulatory Visit | Attending: Urology | Admitting: Urology

## 2021-02-15 DIAGNOSIS — K6812 Psoas muscle abscess: Secondary | ICD-10-CM | POA: Insufficient documentation

## 2021-02-15 HISTORY — PX: IR SINUS/FIST TUBE CHK-NON GI: IMG673

## 2021-02-15 MED ORDER — IOHEXOL 350 MG/ML SOLN
75.0000 mL | Freq: Once | INTRAVENOUS | Status: AC | PRN
  Administered 2021-02-15: 14:00:00 75 mL via INTRAVENOUS

## 2021-02-15 MED ORDER — IOHEXOL 300 MG/ML  SOLN
100.0000 mL | Freq: Once | INTRAMUSCULAR | Status: AC | PRN
  Administered 2021-02-15: 15:00:00 10 mL

## 2021-02-20 ENCOUNTER — Other Ambulatory Visit: Payer: PPO

## 2021-07-19 HISTORY — PX: HERNIA REPAIR: SHX51

## 2021-07-22 ENCOUNTER — Other Ambulatory Visit: Payer: Self-pay | Admitting: Urology

## 2021-07-22 ENCOUNTER — Other Ambulatory Visit (HOSPITAL_COMMUNITY): Payer: Self-pay | Admitting: Urology

## 2021-07-22 DIAGNOSIS — N130 Hydronephrosis with ureteropelvic junction obstruction: Secondary | ICD-10-CM

## 2021-07-30 ENCOUNTER — Encounter (HOSPITAL_COMMUNITY)
Admission: RE | Admit: 2021-07-30 | Discharge: 2021-07-30 | Disposition: A | Discharge status: Home / Self Care | Payer: Medicare HMO | Source: Ambulatory Visit | Attending: Urology | Admitting: Urology

## 2021-07-30 DIAGNOSIS — N130 Hydronephrosis with ureteropelvic junction obstruction: Secondary | ICD-10-CM | POA: Insufficient documentation

## 2021-07-30 MED ORDER — TECHNETIUM TC 99M MERTIATIDE
5.0200 | Freq: Once | INTRAVENOUS | Status: AC | PRN
  Administered 2021-07-30: 5.02 via INTRAVENOUS

## 2021-07-30 MED ORDER — FUROSEMIDE 10 MG/ML IJ SOLN
INTRAMUSCULAR | Status: AC
  Filled 2021-07-30: qty 4

## 2021-07-30 MED ORDER — FUROSEMIDE 10 MG/ML IJ SOLN: 40.0000 mg | Freq: Once | INTRAMUSCULAR | Status: DC

## 2021-08-12 ENCOUNTER — Other Ambulatory Visit: Payer: Self-pay | Admitting: Urology

## 2021-08-21 ENCOUNTER — Encounter (HOSPITAL_BASED_OUTPATIENT_CLINIC_OR_DEPARTMENT_OTHER): Payer: Self-pay | Admitting: Urology

## 2021-08-21 NOTE — Progress Notes (Signed)
Spoke w/ via phone for pre-op interview--- patient  Lab needs dos----  Istat (EKG already ordered by surgeon's office)             Lab results------ in EPIC COVID test -----patient states asymptomatic no test needed Arrive at ------- 0700 on Friday 08/30/21 NPO after MN NO Solid Food.  Clear liquids from MN until--- 0600 on 08/30/21 Med rec completed Medications to take morning of surgery ----- lipitor, buspar, lexapro, synthroid, zofran prn nausea Diabetic medication ----- n/a Patient instructed no nail polish to be worn day of surgery Patient instructed to bring photo id and insurance card day of surgery Patient aware to have Driver (ride ) / caregiver    for 24 hours after surgery -- Valli Glance (partner) [phone] Patient Special Instructions ----- bring CPAP DOS Pre-Op special Istructions ----- none Patient verbalized understanding of instructions that were given at this phone interview. Patient denies shortness of breath, chest pain, fever, cough at this phone interview.  Pt has EKG in Epic on 12/25/20, seen and verified by cardiologist Dr. Irish Lack. Already has order for EKG DOS placed by MD office. Denies CP, DOE.   Lyndel Pleasure, RN

## 2021-08-30 ENCOUNTER — Ambulatory Visit (HOSPITAL_BASED_OUTPATIENT_CLINIC_OR_DEPARTMENT_OTHER): Payer: Medicare HMO | Admitting: Certified Registered Nurse Anesthetist

## 2021-08-30 ENCOUNTER — Encounter (HOSPITAL_BASED_OUTPATIENT_CLINIC_OR_DEPARTMENT_OTHER)
Admission: RE | Disposition: A | Discharge status: Home / Self Care | Payer: Self-pay | Source: Home / Self Care | Attending: Urology

## 2021-08-30 ENCOUNTER — Ambulatory Visit (HOSPITAL_BASED_OUTPATIENT_CLINIC_OR_DEPARTMENT_OTHER)
Admission: RE | Admit: 2021-08-30 | Discharge: 2021-08-30 | Disposition: A | Discharge status: Home / Self Care | Payer: Medicare HMO | Attending: Urology | Admitting: Urology

## 2021-08-30 ENCOUNTER — Encounter (HOSPITAL_BASED_OUTPATIENT_CLINIC_OR_DEPARTMENT_OTHER): Payer: Self-pay | Admitting: Urology

## 2021-08-30 DIAGNOSIS — F419 Anxiety disorder, unspecified: Secondary | ICD-10-CM | POA: Diagnosis not present

## 2021-08-30 DIAGNOSIS — Z905 Acquired absence of kidney: Secondary | ICD-10-CM | POA: Diagnosis not present

## 2021-08-30 DIAGNOSIS — N135 Crossing vessel and stricture of ureter without hydronephrosis: Secondary | ICD-10-CM

## 2021-08-30 DIAGNOSIS — E039 Hypothyroidism, unspecified: Secondary | ICD-10-CM | POA: Diagnosis not present

## 2021-08-30 DIAGNOSIS — Z87891 Personal history of nicotine dependence: Secondary | ICD-10-CM | POA: Diagnosis not present

## 2021-08-30 DIAGNOSIS — I10 Essential (primary) hypertension: Secondary | ICD-10-CM | POA: Diagnosis not present

## 2021-08-30 DIAGNOSIS — N131 Hydronephrosis with ureteral stricture, not elsewhere classified: Secondary | ICD-10-CM | POA: Diagnosis present

## 2021-08-30 DIAGNOSIS — F32A Depression, unspecified: Secondary | ICD-10-CM | POA: Insufficient documentation

## 2021-08-30 DIAGNOSIS — G4730 Sleep apnea, unspecified: Secondary | ICD-10-CM | POA: Diagnosis not present

## 2021-08-30 HISTORY — PX: BALLOON DILATION: SHX5330

## 2021-08-30 HISTORY — PX: CYSTOSCOPY WITH RETROGRADE PYELOGRAM, URETEROSCOPY AND STENT PLACEMENT: SHX5789

## 2021-08-30 LAB — POCT I-STAT, CHEM 8
BUN: 20 mg/dL (ref 6–20)
Calcium, Ion: 1.31 mmol/L (ref 1.15–1.40)
Chloride: 103 mmol/L (ref 98–111)
Creatinine, Ser: 0.9 mg/dL (ref 0.44–1.00)
Glucose, Bld: 102 mg/dL — ABNORMAL HIGH (ref 70–99)
HCT: 41 % (ref 36.0–46.0)
Hemoglobin: 13.9 g/dL (ref 12.0–15.0)
Potassium: 3.7 mmol/L (ref 3.5–5.1)
Sodium: 144 mmol/L (ref 135–145)
TCO2: 28 mmol/L (ref 22–32)

## 2021-08-30 SURGERY — CYSTOURETEROSCOPY, WITH RETROGRADE PYELOGRAM AND STENT INSERTION
Anesthesia: General | Site: Ureter | Laterality: Right

## 2021-08-30 MED ORDER — CEFAZOLIN SODIUM-DEXTROSE 2-4 GM/100ML-% IV SOLN
INTRAVENOUS | Status: AC
  Filled 2021-08-30: qty 100

## 2021-08-30 MED ORDER — LACTATED RINGERS IV SOLN: INTRAVENOUS | Status: DC | PRN

## 2021-08-30 MED ORDER — PROPOFOL 10 MG/ML IV BOLUS
INTRAVENOUS | Status: AC
  Filled 2021-08-30: qty 20

## 2021-08-30 MED ORDER — SODIUM CHLORIDE 0.9 % IR SOLN
Status: DC | PRN
  Administered 2021-08-30: 3000 mL

## 2021-08-30 MED ORDER — OXYCODONE HCL 5 MG/5ML PO SOLN: 5.0000 mg | Freq: Once | ORAL | Status: DC | PRN

## 2021-08-30 MED ORDER — HYDROMORPHONE HCL 1 MG/ML IJ SOLN: 0.2500 mg | INTRAMUSCULAR | Status: DC | PRN

## 2021-08-30 MED ORDER — CEFAZOLIN SODIUM-DEXTROSE 2-4 GM/100ML-% IV SOLN
2.0000 g | INTRAVENOUS | Status: AC
  Administered 2021-08-30: 2 g via INTRAVENOUS

## 2021-08-30 MED ORDER — PROPOFOL 10 MG/ML IV BOLUS
INTRAVENOUS | Status: DC | PRN
  Administered 2021-08-30: 200 mg via INTRAVENOUS

## 2021-08-30 MED ORDER — AMISULPRIDE (ANTIEMETIC) 5 MG/2ML IV SOLN
10.0000 mg | Freq: Once | INTRAVENOUS | Status: DC | PRN
Start: 2021-08-30 — End: 2021-08-30

## 2021-08-30 MED ORDER — LIDOCAINE 2% (20 MG/ML) 5 ML SYRINGE
INTRAMUSCULAR | Status: DC | PRN
  Administered 2021-08-30: 60 mg via INTRAVENOUS

## 2021-08-30 MED ORDER — PROMETHAZINE HCL 25 MG/ML IJ SOLN: 6.2500 mg | INTRAMUSCULAR | Status: DC | PRN

## 2021-08-30 MED ORDER — MIDAZOLAM HCL 2 MG/2ML IJ SOLN
INTRAMUSCULAR | Status: AC
  Filled 2021-08-30: qty 2

## 2021-08-30 MED ORDER — MIDAZOLAM HCL 2 MG/2ML IJ SOLN
INTRAMUSCULAR | Status: DC | PRN
  Administered 2021-08-30: 2 mg via INTRAVENOUS

## 2021-08-30 MED ORDER — DEXAMETHASONE SODIUM PHOSPHATE 10 MG/ML IJ SOLN
INTRAMUSCULAR | Status: DC | PRN
  Administered 2021-08-30: 10 mg via INTRAVENOUS

## 2021-08-30 MED ORDER — SODIUM CHLORIDE 0.9 % IV SOLN: INTRAVENOUS | Status: DC

## 2021-08-30 MED ORDER — FENTANYL CITRATE (PF) 250 MCG/5ML IJ SOLN
INTRAMUSCULAR | Status: DC | PRN
  Administered 2021-08-30: 25 ug via INTRAVENOUS
  Administered 2021-08-30: 50 ug via INTRAVENOUS
  Administered 2021-08-30: 25 ug via INTRAVENOUS

## 2021-08-30 MED ORDER — FENTANYL CITRATE (PF) 100 MCG/2ML IJ SOLN
INTRAMUSCULAR | Status: AC
  Filled 2021-08-30: qty 2

## 2021-08-30 MED ORDER — IOHEXOL 300 MG/ML  SOLN
INTRAMUSCULAR | Status: DC | PRN
  Administered 2021-08-30: 30 mL via URETHRAL

## 2021-08-30 MED ORDER — 0.9 % SODIUM CHLORIDE (POUR BTL) OPTIME
TOPICAL | Status: DC | PRN
  Administered 2021-08-30: 500 mL

## 2021-08-30 MED ORDER — EPHEDRINE SULFATE (PRESSORS) 50 MG/ML IJ SOLN
INTRAMUSCULAR | Status: DC | PRN
  Administered 2021-08-30: 5 mg via INTRAVENOUS

## 2021-08-30 MED ORDER — ONDANSETRON HCL 4 MG/2ML IJ SOLN
INTRAMUSCULAR | Status: DC | PRN
  Administered 2021-08-30: 4 mg via INTRAVENOUS

## 2021-08-30 MED ORDER — MEPERIDINE HCL 25 MG/ML IJ SOLN: 6.2500 mg | INTRAMUSCULAR | Status: DC | PRN

## 2021-08-30 MED ORDER — OXYCODONE HCL 5 MG PO TABS: 5.0000 mg | ORAL_TABLET | Freq: Once | ORAL | Status: DC | PRN

## 2021-08-30 MED ORDER — TRAMADOL HCL 50 MG PO TABS: 50.0000 mg | ORAL_TABLET | Freq: Four times a day (QID) | ORAL | 0 refills | Status: DC | PRN

## 2021-08-30 SURGICAL SUPPLY — 25 items
BAG DRAIN URO-CYSTO SKYTR STRL (DRAIN) ×3 IMPLANT
BAG DRN UROCATH (DRAIN) ×2
BASKET LASER NITINOL 1.9FR (BASKET) IMPLANT
BSKT STON RTRVL 120 1.9FR (BASKET)
CATH URET 5FR 28IN OPEN ENDED (CATHETERS) ×3 IMPLANT
CATH URET DUAL LUMEN 6-10FR 50 (CATHETERS) ×1 IMPLANT
CLOTH BEACON ORANGE TIMEOUT ST (SAFETY) ×3 IMPLANT
EXTRACTOR STONE 1.7FRX115CM (UROLOGICAL SUPPLIES) IMPLANT
GLOVE BIO SURGEON STRL SZ7.5 (GLOVE) ×3 IMPLANT
GOWN STRL REUS W/TWL XL LVL3 (GOWN DISPOSABLE) ×3 IMPLANT
GUIDEWIRE ANG ZIPWIRE 038X150 (WIRE) ×1 IMPLANT
GUIDEWIRE STR DUAL SENSOR (WIRE) ×3 IMPLANT
IV NS IRRIG 3000ML ARTHROMATIC (IV SOLUTION) ×5 IMPLANT
KIT TURNOVER CYSTO (KITS) ×3 IMPLANT
LASER FIB FLEXIVA PULSE ID 365 (Laser) ×1 IMPLANT
MANIFOLD NEPTUNE II (INSTRUMENTS) ×3 IMPLANT
NS IRRIG 500ML POUR BTL (IV SOLUTION) ×3 IMPLANT
PACK CYSTO (CUSTOM PROCEDURE TRAY) ×3 IMPLANT
STENT CONTOUR 8FR X 24 (STENTS) ×1 IMPLANT
STENT ENDOURETEROTOMY 7-14 26C (STENTS) ×1 IMPLANT
SYR 10ML LL (SYRINGE) ×1 IMPLANT
TRACTIP FLEXIVA PULS ID 200XHI (Laser) IMPLANT
TRACTIP FLEXIVA PULSE ID 200 (Laser)
TUBE CONNECTING 12X1/4 (SUCTIONS) ×1 IMPLANT
TUBING UROLOGY SET (TUBING) ×3 IMPLANT

## 2021-08-30 NOTE — Anesthesia Preprocedure Evaluation (Signed)
Anesthesia Evaluation  Patient identified by MRN, date of birth, ID band Patient awake    Reviewed: Allergy & Precautions, NPO status , Patient's Chart, lab work & pertinent test results  History of Anesthesia Complications Negative for: history of anesthetic complications  Airway Mallampati: II  TM Distance: >3 FB Neck ROM: Full    Dental  (+) Dental Advisory Given   Pulmonary sleep apnea , Patient abstained from smoking., former smoker,    breath sounds clear to auscultation       Cardiovascular hypertension, Pt. on medications  Rhythm:Regular Rate:Normal     Neuro/Psych PSYCHIATRIC DISORDERS Anxiety Depression negative neurological ROS     GI/Hepatic negative GI ROS, Neg liver ROS,   Endo/Other  Hypothyroidism   Renal/GU      Musculoskeletal  (+) Arthritis , Osteoarthritis,    Abdominal   Peds  Hematology negative hematology ROS (+)   Anesthesia Other Findings   Reproductive/Obstetrics                             Anesthesia Physical  Anesthesia Plan  ASA: 2  Anesthesia Plan: General   Post-op Pain Management: Minimal or no pain anticipated   Induction: Intravenous  PONV Risk Score and Plan: 3 and Dexamethasone, Treatment may vary due to age or medical condition, Midazolam and Ondansetron  Airway Management Planned: LMA  Additional Equipment:   Intra-op Plan:   Post-operative Plan: Extubation in OR  Informed Consent: I have reviewed the patients History and Physical, chart, labs and discussed the procedure including the risks, benefits and alternatives for the proposed anesthesia with the patient or authorized representative who has indicated his/her understanding and acceptance.     Dental advisory given  Plan Discussed with: Anesthesiologist and CRNA  Anesthesia Plan Comments:         Anesthesia Quick Evaluation

## 2021-08-30 NOTE — Transfer of Care (Signed)
Immediate Anesthesia Transfer of Care Note  Patient: Rhonda Wilkerson  Procedure(s) Performed: RIGHT RETROGRADE PYELOGRAM, RIGHT DIAGNOSTIC URETEROSCOPY, LASER  URETEROTOMY, RIGHT STENT PLACEMENT (Right: Ureter) RIGHT URETERAL BALLOON DILATION (Right)  Patient Location: PACU  Anesthesia Type:General  Level of Consciousness: awake, alert  and oriented  Airway & Oxygen Therapy: Patient Spontanous Breathing  Post-op Assessment: Report given to RN and Post -op Vital signs reviewed and stable  Post vital signs: Reviewed and stable  Last Vitals:  Vitals Value Taken Time  BP 136/102 08/30/21 1053  Temp    Pulse 71 08/30/21 1056  Resp 16 08/30/21 1056  SpO2 98 % 08/30/21 1056  Vitals shown include unvalidated device data.  Last Pain:  Vitals:   08/30/21 9987  TempSrc: Oral  PainSc: 0-No pain      Patients Stated Pain Goal: 3 (21/58/72 7618)  Complications: No notable events documented.

## 2021-08-30 NOTE — Op Note (Signed)
Preoperative diagnosis:  Right ureteral stricture  Postoperative diagnosis:  Same  Procedure: Cystoscopy, retrograde pyelogram with interpretation Right ureteroscopy, laser endopyelotomy/ureterotomy Right ureteral stent placement  Surgeon: Ardis Hughs, MD  Anesthesia: General  Complications: None  Intraoperative findings:  1: The patient's retrograde pyelogram demonstrated a normal caliber ureter up to the mid aspect of the ureter just proximal to the pelvic brim.  At that point there was a blind end of the ureter. #2: I was unable to advance a wire beyond this blind-ending area in the ureter.  Ureteroscopy demonstrated a completely occluded ureter.  I was able to open it up and ultimately find the true lumen, the ureteral stricture seem to be approximately 3 to 4 cm in length. #3: 54 French double-J ureteral stent was placed in the right ureter  EBL: Minimal  Specimens: None  Indication: Rhonda Wilkerson is a 56 y.o. patient with history of ureteral stricture following a complication from a partial nephrectomy.  Initially, stent was placed in the ureteral injury healed.  However, follow-up ultrasound after the stent removed was removed demonstrated a hydronephrotic kidney, Lasix renogram demonstrated high-grade obstruction.  After reviewing the management options for treatment, he elected to proceed with the above surgical procedure(s). We have discussed the potential benefits and risks of the procedure, side effects of the proposed treatment, the likelihood of the patient achieving the goals of the procedure, and any potential problems that might occur during the procedure or recuperation. Informed consent has been obtained.  Description of procedure:  The patient was taken to the operating room and general anesthesia was induced.  The patient was placed in the dorsal lithotomy position, prepped and draped in the usual sterile fashion, and preoperative antibiotics were  administered. A preoperative time-out was performed.   20 French 3 degree cystoscope was gently passed through the patient's urethra and into the bladder under visual guidance.  Cystoscopy demonstrated normal appearing bladder.  I cannulated the right ureteral orifice with a 5 French open ureteral catheter and performed retrograde pyelogram with the above findings.  I then attempted several times to the pokes through this scar/strictured area with a wire unsuccessfully.  I left the wire in the ureter and with a dual-lumen semirigid ureteroscope advanced the scope into the patient's bladder and cannulated the right ureteral orifice advancing up to the occluded area.  I was unable to find the ureter opening, because of the dense scar tissue.  I then slowly started to laser the anterior wall of the ureter at the strictured segment slowly advancing along the way.  Ultimately I was able to get into a space that was somewhat familiar and consistent with ureteral appearance.  However, was unable to get the wire into the renal pelvis.  I then slowly backed the scope back and noted that I was on the outside of the ureter, once I was back in the true lumen proximal to the scar I lasered the posterior tissue and at this point was able to cannulate the true lumen and advance the wire into the renal pelvis.  I subsequently advanced the ureteroscope into the proximal ureter.  Retrograde pyelogram at this point demonstrated a blunted dilated ureteral pelvis.  I then pulled back to the scar tissue and lasered the scar on the anterior portion of the ureter full-thickness.  Once the ureter was opened through the scar I removed the scope and passed a dual-lumen catheter advanced a second guidewire up into the renal pelvis.  Again I injected  contrast through the dual-lumen catheter noting that I remained in the collecting system.  I then attempted to pass an Endo pyelolithotomy stent, 14/7 [place] unsuccessfully.  I was unable to get  it into the strictured segment.  As such, I opted to pass an 8 [place] stent which I was able to get up without any resistance or complication.  The stent tether was cut and the stent was noted to be well positioned within the renal pelvis as well as in the bladder.  The bladder was subsequently emptied and the cystoscope removed.  The patient was subsequently returned to PACU in stable condition.  Disposition: The patient will be following up with me in 2 weeks for further discussion.  Ardis Hughs, M.D.

## 2021-08-30 NOTE — Discharge Instructions (Addendum)
DISCHARGE INSTRUCTIONS FOR KIDNEY STONE/URETERAL STENT   MEDICATIONS:  1.  Resume all your other meds from home - except do not take any extra narcotic pain meds that you may have at home.  2. Tramadol is for moderate/severe pain, otherwise taking upto 1000 mg every 6 hours of plainTylenol will help treat your pain.    ACTIVITY:  1. No strenuous activity x 1week  2. No driving while on narcotic pain medications  3. Drink plenty of water  4. Continue to walk at home - you can still get blood clots when you are at home, so keep active, but don't over do it.  5. May return to work/school tomorrow or when you feel ready   BATHING:  1. You can shower and we recommend daily showers   SIGNS/SYMPTOMS TO CALL:  Please call us if you have a fever greater than 101.5, uncontrolled nausea/vomiting, uncontrolled pain, dizziness, unable to urinate, bloody urine, chest pain, shortness of breath, leg swelling, leg pain, redness around wound, drainage from wound, or any other concerns or questions.   You can reach [place] at [phone].   FOLLOW-UP:  1. You have an appointment in 2 weeks.    Post Anesthesia Home Care Instructions  Activity: Get plenty of rest for the remainder of the day. A responsible individual must stay with you for 24 hours following the procedure.  For the next 24 hours, DO NOT: -Drive a car -Paediatric nurse -Drink alcoholic beverages -Take any medication unless instructed by your physician -Make any legal decisions or sign important papers.  Meals: Start with liquid foods such as gelatin or soup. Progress to regular foods as tolerated. Avoid greasy, spicy, heavy foods. If nausea and/or vomiting occur, drink only clear liquids until the nausea and/or vomiting subsides. Call your physician if vomiting continues.  Special Instructions/Symptoms: Your throat may feel dry or sore from the anesthesia or the breathing tube placed in your throat during surgery. If this causes  discomfort, gargle with warm salt water. The discomfort should disappear within 24 hours.

## 2021-08-30 NOTE — Anesthesia Procedure Notes (Signed)
Procedure Name: LMA Insertion Date/Time: 08/30/2021 9:11 AM  Performed by: Clearnce Sorrel, CRNAPre-anesthesia Checklist: Patient identified, Emergency Drugs available, Suction available and Patient being monitored Patient Re-evaluated:Patient Re-evaluated prior to induction Oxygen Delivery Method: Circle System Utilized Preoxygenation: Pre-oxygenation with 100% oxygen Induction Type: IV induction Ventilation: Mask ventilation without difficulty LMA: LMA inserted LMA Size: 4.0 Number of attempts: 1 Airway Equipment and Method: Bite block Placement Confirmation: positive ETCO2 Tube secured with: Tape Dental Injury: Teeth and Oropharynx as per pre-operative assessment

## 2021-08-30 NOTE — H&P (Signed)
56 year old female who is 1 month status post right partial nephrectomy. Her postoperative course has been complicated by a incarcerated piece of omentum in her midline incision that was creating some pain. This is subsequently resolved. However, over the past week she has developed pain over the right abdomen and flank that is radiating down anteriorly or on her thigh. She has had low-grade fevers. Her pain has been very difficult to control. She did have a CT scan that showed a small area of fluid in this area, but no other findings. There was some inflammation within the psoas, the right kidney was delayed in excretion. There was mild hydronephrosis.   Over the course of the last 5 days the patient's symptoms have not progressed, but have not improved. She has been on antibiotics, and denies any fevers. She denies any voiding symptoms.   02/06/2021: The patient underwent a right ureteral stent on Thursday. She subsequently has done reasonably well, with no fevers or ongoing flank pain. She is having some right sided abdominal pain. The pain radiating down her leg has gotten better. However today, she seems to be having worse pain.   02/19/21: The patient underwent a CT scan at her last office visit demonstrating a large psoas fluid collection. She was subsequently admitted to the hospital and a drain was placed. Drain cultures were negative. Drain removed by IR 10 days later.   3/23: Patient underwent a right retrograde pyelogram and stent removal under general anesthesia. The retrograde demonstrated normal caliber ureters and no contrast extravasation or filling defects.   4/23: The patient presented for follow-up and was doing great. However, her ultrasound did demonstrate hydronephrosis of the right kidney.   5/23: Follow-up renal ultrasound demonstrated hydronephrosis. She subsequently had a MAG3 Lasix renogram demonstrating high-grade obstruction in the right kidney. She had 35% function in the  right kidney, 65% function in the left kidney with no obstruction on the left.   Interval: Today the patient is here to discuss her Lasix renogram. She denies any pain. She denies any voiding symptoms. She denies any gross hematuria.   The patient did have her ventral hernia repaired 2 weeks ago.     ALLERGIES: No Known Allergies    MEDICATIONS: Atorvastatin Calcium 10 mg tablet  Buspirone Hcl 5 mg tablet  Escitalopram Oxalate 10 mg tablet  Folic Acid  Folic Acid 1 mg tablet  Levothyroxine Sodium 25 mcg capsule  Linzess 145 mcg capsule  Losartan-Hydrochlorothiazide 100 mg-25 mg tablet  Ondansetron Hcl 4 mg tablet  Vitamin B12     GU PSH: Cysto Remove Stent FB Sim, Right - 04/19/2021 Locm 300-'399Mg'$ /Ml Iodine,1Ml - 02/06/2021 Partial nephrectomy (laparoscopic) - 12/28/2020       PSH Notes: Breast reduction (12/2019), bowel resection (08/2014), right knee meniscus (11/2017), right ovary removed (10/2002), partial thyroidectomy (2006), Breast reduction (10/08/2020)   NON-GU PSH: Carpal tunnel surgery, 08/2011 Hernia Repair - 07/19/2021     GU PMH: Hydronephrosis - 07/11/2021, - 06/03/2021, - 04/02/2021, - 02/19/2021, - 02/06/2021, - 01/31/2021 Benign tumor right kidney - 04/02/2021, - 02/19/2021, - 02/06/2021, - 01/31/2021, - 10/09/2020 Ischemia and infarction of kidney - 04/02/2021, - 01/28/2021 Benign Neo Kidney, Unspec - 01/30/2021, - 01/28/2021, - 01/14/2021, - 12/18/2020      PMH Notes: Crohns   NON-GU PMH: Anxiety Arthritis Depression Hypercholesterolemia Hypertension Hypothyroidism Sleep Apnea    FAMILY HISTORY: Atrial Fibrillation - Father, Brother Breast Cancer - Runs in Family Glaucoma - Father nephrolithiasis - Brother   SOCIAL HISTORY:  Marital Status: Unknown Preferred Language: English; Ethnicity: Not Hispanic Or Latino; Race: White Current Smoking Status: Patient does not smoke anymore.   Tobacco Use Assessment Completed: Used Tobacco in last 30 days? Does not use  smokeless tobacco. Social Drinker.  Does not use drugs. Drinks 2 caffeinated drinks per day. Has not had a blood transfusion.     Notes: quit smoking 10 years ago 1/2 pack per week  ETOH beer twice per week    REVIEW OF SYSTEMS:    GU Review Female:   Patient reports get up at night to urinate. Patient denies frequent urination, hard to postpone urination, burning /pain with urination, leakage of urine, stream starts and stops, trouble starting your stream, have to strain to urinate, and being pregnant.  Gastrointestinal (Upper):   Patient denies nausea, vomiting, and indigestion/ heartburn.  Gastrointestinal (Lower):   Patient denies diarrhea and constipation.  Constitutional:   Patient denies fever, night sweats, weight loss, and fatigue.  Skin:   Patient denies skin rash/ lesion and itching.  Eyes:   Patient denies blurred vision and double vision.  Ears/ Nose/ Throat:   Patient denies sore throat and sinus problems.  Hematologic/Lymphatic:   Patient denies swollen glands and easy bruising.  Cardiovascular:   Patient denies leg swelling and chest pains.  Respiratory:   Patient denies cough and shortness of breath.  Endocrine:   Patient denies excessive thirst.  Musculoskeletal:   Patient denies joint pain and back pain.  Neurological:   Patient denies headaches and dizziness.  Psychologic:   Patient denies depression and anxiety.   VITAL SIGNS: None   MULTI-SYSTEM PHYSICAL EXAMINATION:    Constitutional: Well-nourished. No physical deformities. Normally developed. Good grooming.  Respiratory: Normal breath sounds. No labored breathing, no use of accessory muscles.   Cardiovascular: Regular rate and rhythm. No murmur, no gallop. Normal temperature, normal extremity pulses, no swelling, no varicosities.      Complexity of Data:  Source Of History:  Patient  Records Review:   Previous Doctor Records, Previous Patient Records, POC Tool  Urine Test Review:   Urinalysis  X-Ray  Review: Lasix Renogram: Reviewed Films. Discussed With Patient.     PROCEDURES:          Urinalysis Dipstick Dipstick Cont'd  Color: Yellow Bilirubin: Neg mg/dL  Appearance: Clear Ketones: Neg mg/dL  Specific Gravity: 1.025 Blood: Neg ery/uL  pH: <=5.0 Protein: Neg mg/dL  Glucose: Neg mg/dL Urobilinogen: 0.2 mg/dL    Nitrites: Neg    Leukocyte Esterase: Neg leu/uL    ASSESSMENT:      ICD-10 Details  1 GU:   Hydronephrosis - N13.0    PLAN:           Document Letter(s):  Created for Patient: Clinical Summary         Notes:   The patient has obstruction of her right ureter at the site of her previous injury. I went through the Lasix renogram with her and explained the results. I recommended that we proceed to the OR for ureteral balloon dilation possible laser ureterotomy. I explained the rationale for this procedure as well as the risk and benefits. I recommended that we try to do this in the near future to minimize any further obstruction of that right kidney.

## 2021-08-30 NOTE — Interval H&P Note (Signed)
History and Physical Interval Note:  08/30/2021 9:00 AM  Rhonda Wilkerson  has presented today for surgery, with the diagnosis of RIGHT URETERAL STRICTURE.  The various methods of treatment have been discussed with the patient and family. After consideration of risks, benefits and other options for treatment, the patient has consented to  Procedure(s) with comments: RIGHT RETROGRADE PYELOGRAM, RIGHT DIAGNOSTIC URETEROSCOPY POSSIBLE LASER  URETEROTOMY (Right) RIGHT URETERAL BALLOON DILATION (Right) - 45 MINS FOR CASE as a surgical intervention.  The patient's history has been reviewed, patient examined, no change in status, stable for surgery.  I have reviewed the patient's chart and labs.  Questions were answered to the patient's satisfaction.     Ardis Hughs

## 2021-08-30 NOTE — Anesthesia Postprocedure Evaluation (Signed)
Anesthesia Post Note  Patient: Arthea Nobel  Procedure(s) Performed: RIGHT RETROGRADE PYELOGRAM, RIGHT DIAGNOSTIC URETEROSCOPY, LASER  URETEROTOMY, RIGHT STENT PLACEMENT (Right: Ureter) RIGHT URETERAL BALLOON DILATION (Right)     Patient location during evaluation: PACU Anesthesia Type: General Level of consciousness: awake and alert Pain management: pain level controlled Vital Signs Assessment: post-procedure vital signs reviewed and stable Respiratory status: spontaneous breathing, nonlabored ventilation and respiratory function stable Cardiovascular status: blood pressure returned to baseline and stable Postop Assessment: no apparent nausea or vomiting Anesthetic complications: no   No notable events documented.  Last Vitals:  Vitals:   08/30/21 1130 08/30/21 1200  BP: (!) 152/67 (!) 144/78  Pulse: 61 62  Resp: 12 16  Temp: (!) 36.3 C (!) 36.3 C  SpO2: 97% 100%    Last Pain:  Vitals:   08/30/21 1200  TempSrc:   PainSc: 0-No pain                 Lynda Rainwater

## 2021-09-02 ENCOUNTER — Encounter (HOSPITAL_BASED_OUTPATIENT_CLINIC_OR_DEPARTMENT_OTHER): Payer: Self-pay | Admitting: Urology

## 2021-11-11 IMAGING — CT CT ABD-PELV W/ CM
2 of 5 series · 15 of 46 positions shown, 17 images · IV contrast (iopamidol)
Comparison: Abdominal radiograph on 01/06/2019; no prior CT

CLINICAL DATA: Abdominal pain and bloating for 1 week. Crohn
disease. Dilated bowel loops on recent abdominal radiograph.

EXAM:
CT ABDOMEN AND PELVIS WITH CONTRAST
TECHNIQUE: Multidetector CT imaging of the abdomen and pelvis was performed
using the standard protocol following bolus administration of
intravenous contrast.
CONTRAST:  100mL V9H2PV-XCC IOPAMIDOL (V9H2PV-XCC) INJECTION 61%

[Series 2: abd pelvis 5.00 br40 s3 axial · axial · 0.74mm/px · z∈[+1287,+1747]mm · 12 of 104 slices shown, 14 images]
[im 6/104  soft-tissue]
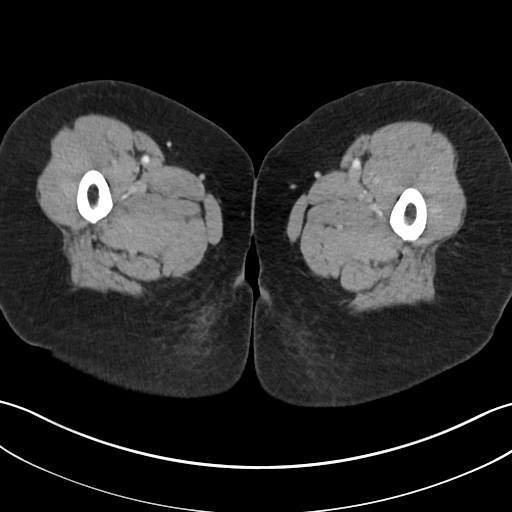
[im 6/104  bone]
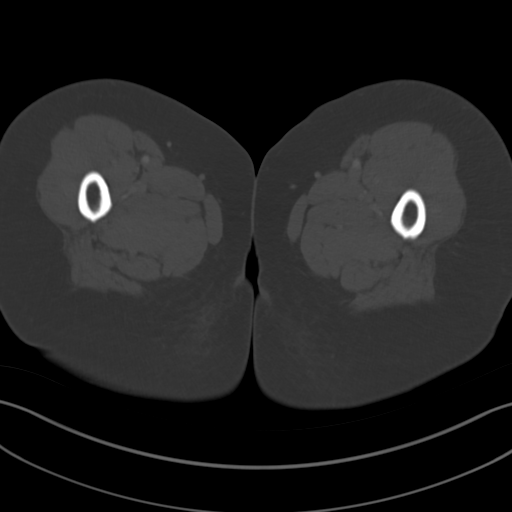
[im 17/104  soft-tissue]
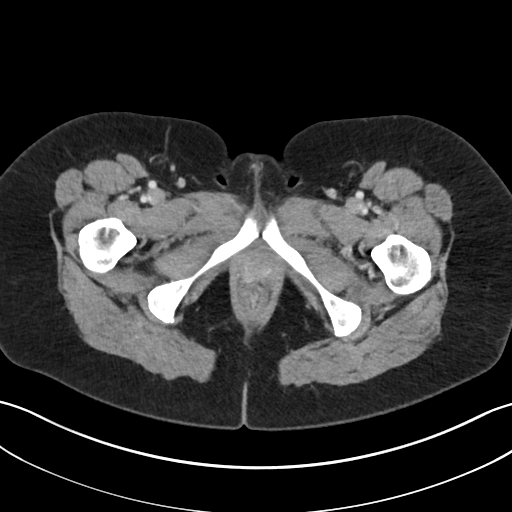
[im 22/104  soft-tissue]
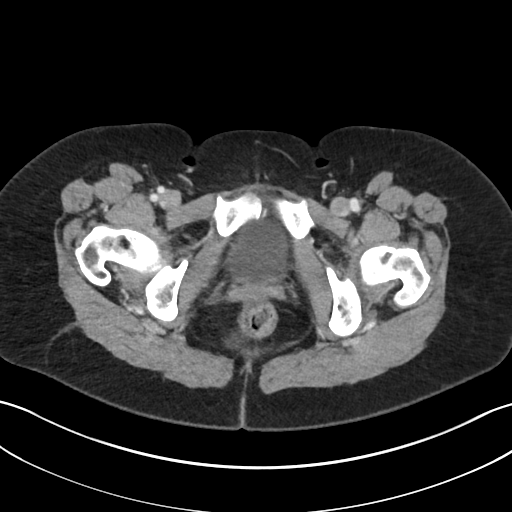
[im 33/104  soft-tissue]
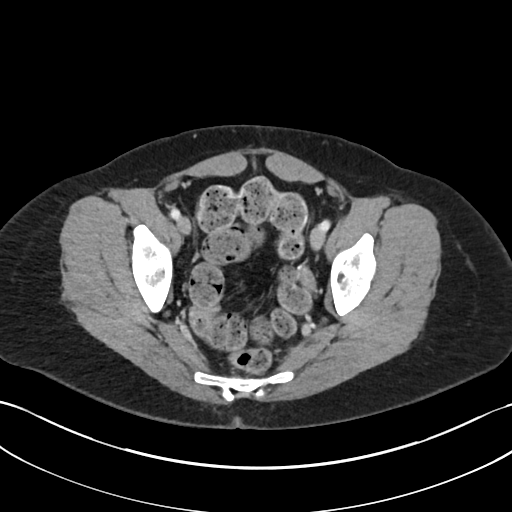
[im 38/104  soft-tissue]
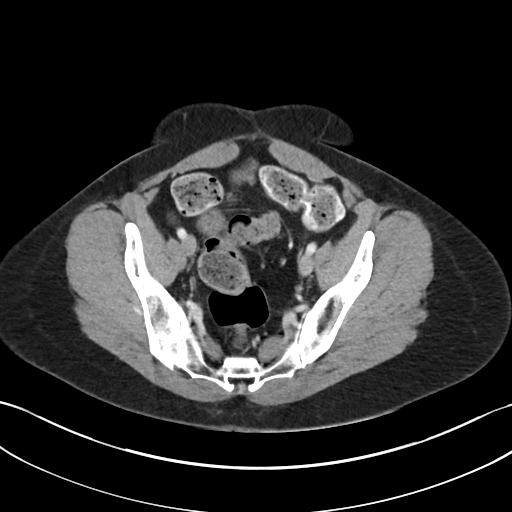
[im 49/104  soft-tissue]
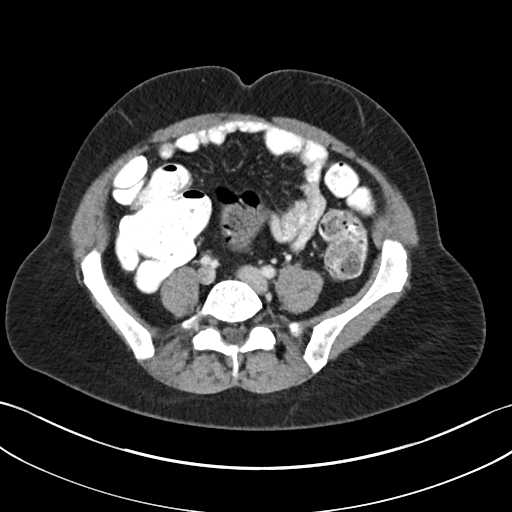
[im 55/104  soft-tissue]
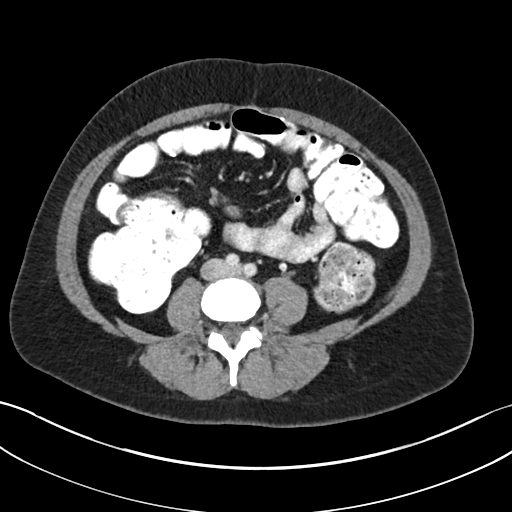
[im 66/104  soft-tissue]
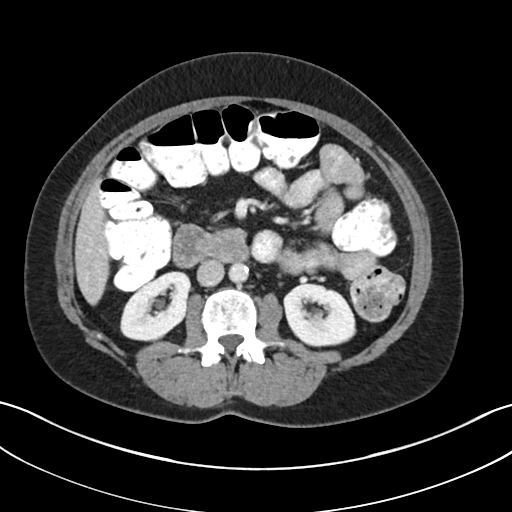
[im 71/104  soft-tissue]
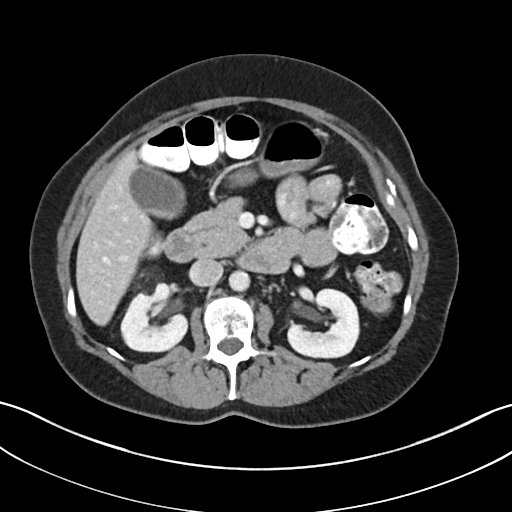
[im 71/104  bone]
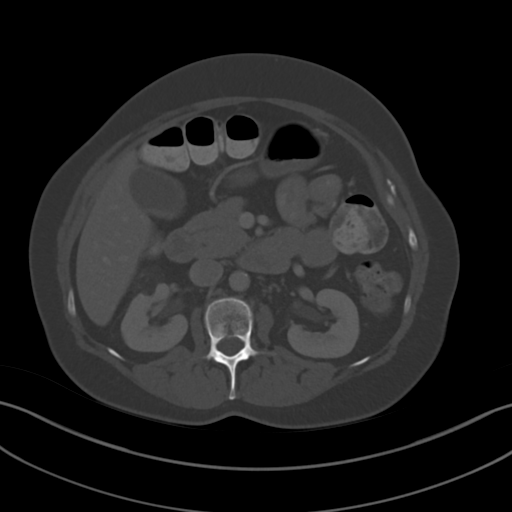
[im 82/104  soft-tissue]
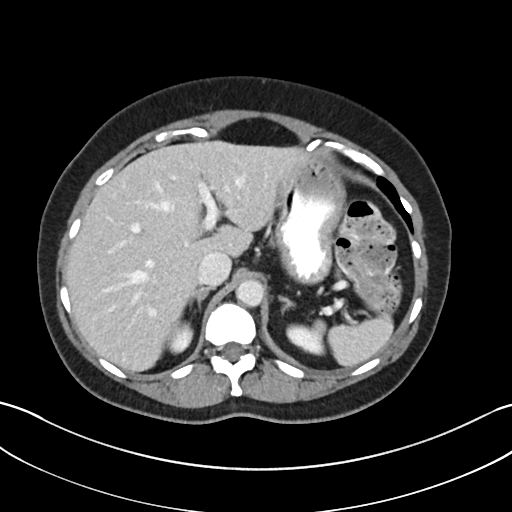
[im 87/104  soft-tissue]
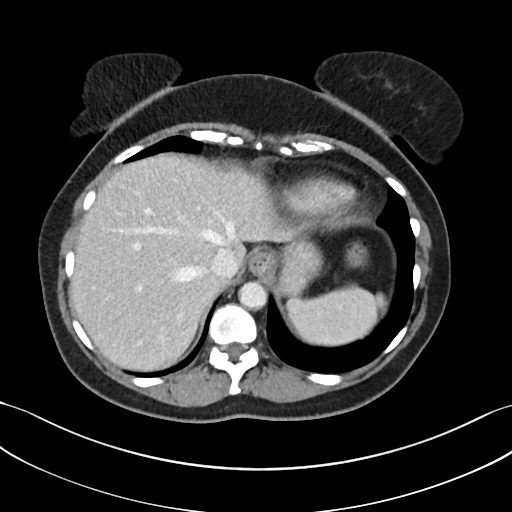
[im 98/104  soft-tissue]
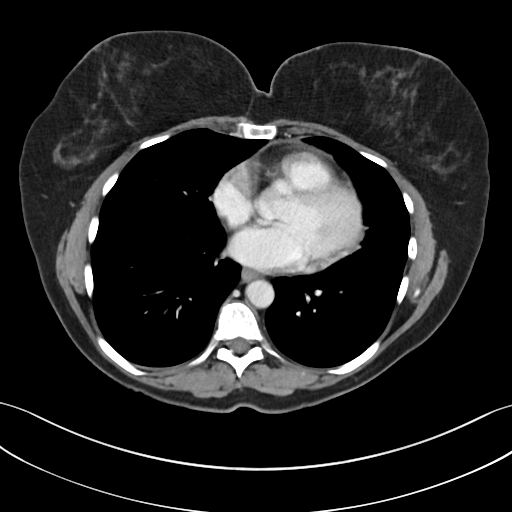

[Series 6: abd pelvis 2.00 br40 s3 cor · coronal · 0.74mm/px · 3 of 155 slices shown]
[im 52/155  soft-tissue]
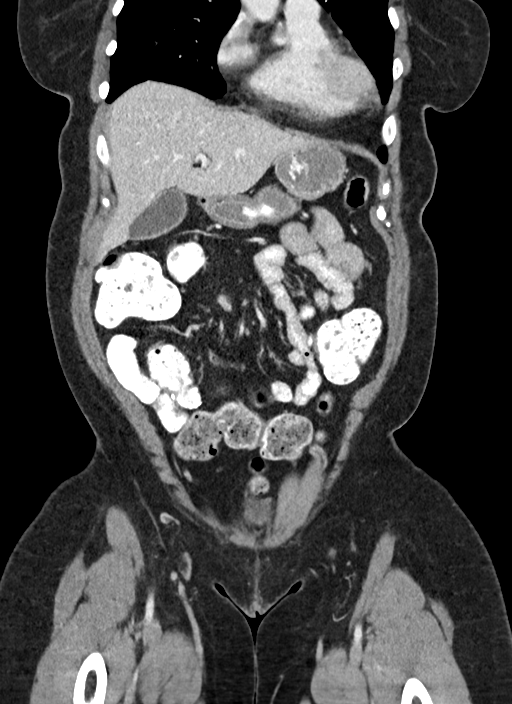
[im 69/155  soft-tissue]
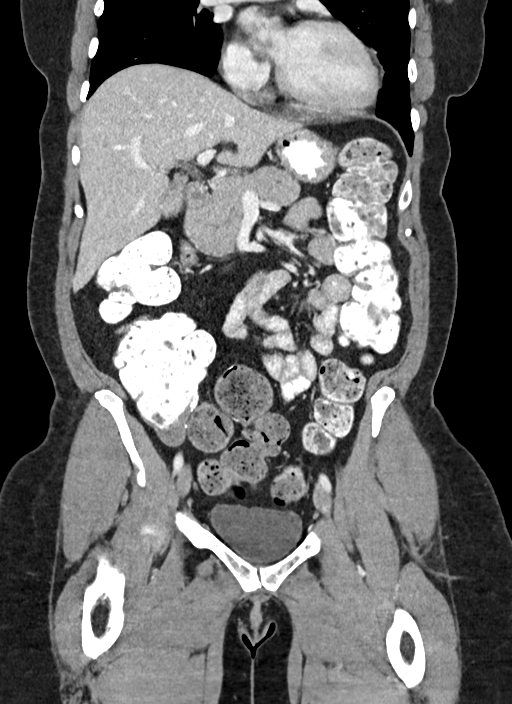
[im 86/155  soft-tissue]
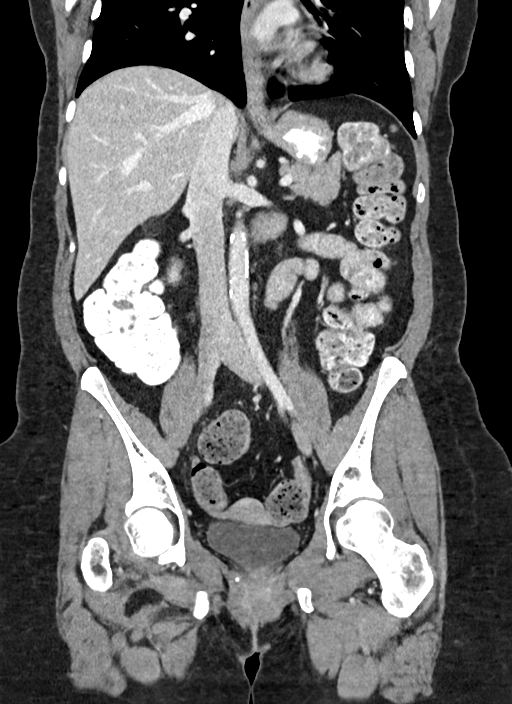

[15 of 46 positions shown; findings below may reference images not displayed]

FINDINGS: Lower Chest: No acute findings.

Hepatobiliary: No hepatic masses identified. Tiny gallstones are
seen, however there is no evidence of cholecystitis or biliary
dilatation.

Pancreas: No mass or inflammatory changes. Pancreas divisum noted,
although there is no evidence of pancreatic ductal dilatation.

Spleen: Within normal limits in size and appearance.

Adrenals/Urinary Tract: A subcapsular fat attenuation mass is seen
in the anterior midpole the right kidney which measures 2.3 x
cm. This consistent with a benign angiomyolipoma. No other renal
masses identified. No evidence of ureteral calculi or
hydronephrosis.

Stomach/Bowel: Bowel is well opacified by oral contrast. No evidence
of dilated bowel loops or bowel obstruction. Surgical staples seen
at the ileocecal anastomosis, however there is no evidence of small
or large bowel wall thickening. No other inflammatory process or
abnormal fluid collections identified. Large amount of stool seen
throughout the colon.

Vascular/Lymphatic: No pathologically enlarged lymph nodes. No
abdominal aortic aneurysm. Aortic atherosclerosis.

Reproductive:  No mass or other significant abnormality.

Other:  None.

Musculoskeletal:  No suspicious bone lesions identified.
IMPRESSION: 1. No evidence of active Crohn's disease or other acute findings. No
evidence of bowel obstruction.
2. Large stool burden noted; recommend clinical correlation for
possible constipation.
3. Cholelithiasis. No radiographic evidence of cholecystitis or
biliary dilatation.
4. 2.3 cm benign right renal angiomyolipoma.

Aortic Atherosclerosis ([id number]).

## 2022-06-09 ENCOUNTER — Other Ambulatory Visit: Payer: Self-pay | Admitting: Family Medicine

## 2022-06-09 DIAGNOSIS — E049 Nontoxic goiter, unspecified: Secondary | ICD-10-CM

## 2022-06-20 ENCOUNTER — Ambulatory Visit
Admission: RE | Admit: 2022-06-20 | Discharge: 2022-06-20 | Disposition: A | Discharge status: Home / Self Care | Payer: Medicare HMO | Source: Ambulatory Visit | Attending: Family Medicine | Admitting: Family Medicine

## 2022-06-20 DIAGNOSIS — E049 Nontoxic goiter, unspecified: Secondary | ICD-10-CM

## 2022-07-17 ENCOUNTER — Other Ambulatory Visit: Payer: Self-pay | Admitting: Family Medicine

## 2022-07-17 DIAGNOSIS — E041 Nontoxic single thyroid nodule: Secondary | ICD-10-CM

## 2022-08-14 ENCOUNTER — Other Ambulatory Visit (HOSPITAL_COMMUNITY)
Admission: RE | Admit: 2022-08-14 | Discharge: 2022-08-14 | Disposition: A | Discharge status: Home / Self Care | Payer: Medicare HMO | Source: Ambulatory Visit | Attending: Family Medicine | Admitting: Family Medicine

## 2022-08-14 ENCOUNTER — Ambulatory Visit
Admission: RE | Admit: 2022-08-14 | Discharge: 2022-08-14 | Disposition: A | Discharge status: Home / Self Care | Payer: Medicare HMO | Source: Ambulatory Visit | Attending: Family Medicine | Admitting: Family Medicine

## 2022-08-14 DIAGNOSIS — E041 Nontoxic single thyroid nodule: Secondary | ICD-10-CM | POA: Insufficient documentation

## 2022-08-14 NOTE — Procedures (Signed)
PROCEDURE SUMMARY:  Using direct ultrasound guidance, 5 passes were made using 25 g needles into the nodule within the right mid lobe of the thyroid.   Ultrasound was used to confirm needle placements on all occasions.   EBL = trace  Specimens were sent to Pathology for analysis.  See procedure note under Imaging tab in Epic for full procedure details.  Kennieth Francois PA-C 08/14/2022 4:29 PM

## 2022-08-15 LAB — CYTOLOGY - NON PAP

## 2023-06-12 ENCOUNTER — Encounter: Payer: Self-pay | Admitting: Gastroenterology

## 2023-08-04 ENCOUNTER — Encounter: Payer: Self-pay | Admitting: Gastroenterology

## 2023-08-04 ENCOUNTER — Ambulatory Visit: Admitting: Gastroenterology

## 2023-08-04 ENCOUNTER — Other Ambulatory Visit (INDEPENDENT_AMBULATORY_CARE_PROVIDER_SITE_OTHER)

## 2023-08-04 VITALS — BP 120/82 | HR 87 | Ht 65.0 in | Wt 169.4 lb

## 2023-08-04 DIAGNOSIS — K5080 Crohn's disease of both small and large intestine without complications: Secondary | ICD-10-CM

## 2023-08-04 DIAGNOSIS — R110 Nausea: Secondary | ICD-10-CM

## 2023-08-04 DIAGNOSIS — R140 Abdominal distension (gaseous): Secondary | ICD-10-CM

## 2023-08-04 DIAGNOSIS — K5900 Constipation, unspecified: Secondary | ICD-10-CM

## 2023-08-04 DIAGNOSIS — D84821 Immunodeficiency due to drugs: Secondary | ICD-10-CM

## 2023-08-04 DIAGNOSIS — K50919 Crohn's disease, unspecified, with unspecified complications: Secondary | ICD-10-CM | POA: Diagnosis not present

## 2023-08-04 DIAGNOSIS — Z9049 Acquired absence of other specified parts of digestive tract: Secondary | ICD-10-CM

## 2023-08-04 DIAGNOSIS — Z79899 Other long term (current) drug therapy: Secondary | ICD-10-CM

## 2023-08-04 LAB — COMPREHENSIVE METABOLIC PANEL WITH GFR
ALT: 24 U/L (ref 0–35)
AST: 22 U/L (ref 0–37)
Albumin: 4.3 g/dL (ref 3.5–5.2)
Alkaline Phosphatase: 61 U/L (ref 39–117)
BUN: 24 mg/dL — ABNORMAL HIGH (ref 6–23)
CO2: 31 meq/L (ref 19–32)
Calcium: 10.3 mg/dL (ref 8.4–10.5)
Chloride: 102 meq/L (ref 96–112)
Creatinine, Ser: 0.86 mg/dL (ref 0.40–1.20)
GFR: 74.83 mL/min (ref >60.00)
Glucose, Bld: 100 mg/dL — ABNORMAL HIGH (ref 70–99)
Potassium: 4.1 meq/L (ref 3.5–5.1)
Sodium: 140 meq/L (ref 135–145)
Total Bilirubin: 0.7 mg/dL (ref 0.2–1.2)
Total Protein: 7.2 g/dL (ref 6.0–8.3)

## 2023-08-04 LAB — C-REACTIVE PROTEIN: CRP: <1 mg/dL (ref 0.5–20.0)

## 2023-08-04 LAB — CBC
HCT: 41.1 % (ref 36.0–46.0)
Hemoglobin: 13.8 g/dL (ref 12.0–15.0)
MCHC: 33.4 g/dL (ref 30.0–36.0)
MCV: 86.6 fl (ref 78.0–100.0)
Platelets: 283 10*3/uL (ref 150.0–400.0)
RBC: 4.75 Mil/uL (ref 3.87–5.11)
RDW: 13.6 % (ref 11.5–15.5)
WBC: 7.6 10*3/uL (ref 4.0–10.5)

## 2023-08-04 LAB — SEDIMENTATION RATE: Sed Rate: 14 mm/h (ref 0–30)

## 2023-08-04 MED ORDER — LINACLOTIDE 145 MCG PO CAPS: 145.0000 ug | ORAL_CAPSULE | Freq: Every day | ORAL | 2 refills | Status: DC

## 2023-08-04 NOTE — Patient Instructions (Signed)
 Your provider has requested that you go to the basement level for lab work before leaving today. Press B on the elevator. The lab is located at the first door on the left as you exit the elevator.  You have been given a testing kit to check for small intestine bacterial overgrowth (SIBO) which is completed by a company named [facility]. Make sure to return your test in the mail using the return mailing label given to you along with the kit. The test order, your demographic and insurance information have all already been sent to the company. [facility] will collect an upfront charge of $109.00 for commercial insurance plans and $229.00 if you are paying cash. The potential remaining total after claim submission and review is $120.00. Make sure to discuss with [facility] PRIOR to having the test to see if they have gotten information from your insurance company as to how much your testing will cost out of pocket, if any. Please contact [facility] at phone number [phone] to get instructions regarding how to perform the test as our office is unable to give specific testing instructions.   You will be due for a recall colonoscopy in 02/2024. We will send you a reminder in the mail when it gets closer to that time.  Due to recent changes in healthcare laws, you may see the results of your imaging and laboratory studies on MyChart before your provider has had a chance to review them.  We understand that in some cases there may be results that are confusing or concerning to you. Not all laboratory results come back in the same time frame and the provider may be waiting for multiple results in order to interpret others.  Please give us  48 hours in order for your provider to thoroughly review all the results before contacting the office for clarification of your results.   Thank you for choosing me and [facility].  Dr. Brice Campi

## 2023-08-07 ENCOUNTER — Other Ambulatory Visit: Payer: Self-pay

## 2023-08-07 ENCOUNTER — Telehealth: Payer: Self-pay

## 2023-08-07 ENCOUNTER — Encounter: Payer: Self-pay | Admitting: Gastroenterology

## 2023-08-07 ENCOUNTER — Ambulatory Visit: Payer: Self-pay | Admitting: Gastroenterology

## 2023-08-07 DIAGNOSIS — D84821 Immunodeficiency due to drugs: Secondary | ICD-10-CM | POA: Insufficient documentation

## 2023-08-07 DIAGNOSIS — K5900 Constipation, unspecified: Secondary | ICD-10-CM | POA: Insufficient documentation

## 2023-08-07 DIAGNOSIS — R140 Abdominal distension (gaseous): Secondary | ICD-10-CM | POA: Insufficient documentation

## 2023-08-07 DIAGNOSIS — Z9049 Acquired absence of other specified parts of digestive tract: Secondary | ICD-10-CM | POA: Insufficient documentation

## 2023-08-07 DIAGNOSIS — K5080 Crohn's disease of both small and large intestine without complications: Secondary | ICD-10-CM | POA: Insufficient documentation

## 2023-08-07 DIAGNOSIS — R110 Nausea: Secondary | ICD-10-CM | POA: Insufficient documentation

## 2023-08-07 LAB — QUANTIFERON-TB GOLD PLUS
Mitogen-NIL: 7.81 [IU]/mL
NIL: 0.02 [IU]/mL
QuantiFERON-TB Gold Plus: NEGATIVE
TB1-NIL: 0.01 [IU]/mL
TB2-NIL: 0 [IU]/mL

## 2023-08-07 LAB — IGA: Immunoglobulin A: 159 mg/dL (ref 47–310)

## 2023-08-07 LAB — TISSUE TRANSGLUTAMINASE, IGA: (tTG) Ab, IgA: <1 U/mL

## 2023-08-07 MED ORDER — USTEKINUMAB 45 MG/0.5ML ~~LOC~~ SOSY: 45.0000 mg | PREFILLED_SYRINGE | SUBCUTANEOUS | 0 refills | Status: DC

## 2023-08-07 NOTE — Telephone Encounter (Signed)
 Prescription sent as ordered for Stelara 45 mg every 8 weeks.

## 2023-08-07 NOTE — Telephone Encounter (Signed)
The pt has been advised 

## 2023-08-07 NOTE — Telephone Encounter (Signed)
 Patient needs refill on her Stelara. This will be our 1st time filling for the patient. Not sure if there is protocol for this or not. Patient would like for RX to go [facility]- she gave ph # [phone].

## 2023-08-07 NOTE — Progress Notes (Signed)
 GASTROENTEROLOGY OUTPATIENT CLINIC VISIT   Primary Care Provider Allene Ivan, MD [address] [place] [address] [place] [address] [phone]  Referring Provider Allene Ivan, MD [address] [place] [address] [place],  [place] [address] [phone]  Patient Profile: Rhonda Wilkerson is a 58 y.o. female with a pmh significant for hypertension, hypothyroidism, OSA, arthritis, anxiety, MDD, Crohn's disease (status post ileocecectomy on Stelara).  The patient presents to the [facility] for an evaluation and management of problem(s) noted below:  Problem List 1. Crohn's disease of both small and large intestine without complication (HCC)   2. History of ileocecectomy   3. Immunosuppression due to drug therapy (HCC)   4. Bloating   5. Nausea   6. Constipation, unspecified constipation type    Discussed the use of AI scribe software for clinical note transcription with the patient, who gave verbal consent to proceed.  History of Present Illness This is the patient's first visit to the [facility] GI clinic.  She is transitioning her care from [facility].  She has a history of Crohn's disease on biologic therapy.  She is accompanied by her wife for today's visit.  She has had GI issues for years.  Formal diagnosis of Crohn's disease however occurred back in 2015/2016 during a hospitalization for severe N/V/D and findings on CT concerning for Crohn's disease.  She eventually had to have terminal ileocectomy with initiation of biologics.  She failed Remicade and Humira and eventually was transitioned to Stelara for which she has been on for years.  During her time in [place], she followed with Dr. Lavaughn Portland and since there was minimal activity/disease and symptoms decision was made to reduce her Stelara dose from 90 mg to 45 mg.  She takes folic acid  daily.  Symptomatically, she experiences regular bloating and uses Linzess  daily to manage constipation, which helps maintain regular  bowel movements, typically once a day. Occasionally, she experiences diarrhea in what she describes as a small Crohn's flare-up which she thinks occurred after eating almonds associated with pain and diarrhea and bloating.  She also has arthritis but believes it to be osteoarthritis and she has not formally been followed by Rheumatology or been told she has Psoriatic Arthritis or IBD-related arthropathy.  She never been formally tested for SIBO or EPI.  She and her wife are concerned about the risks of long-term medication use, including cancer risks, but is also worried about the potential for her condition to worsen if she stops medication.  They wonder about stopping Stelara at some point in the future.  Her last colonoscopy was in 2022 (documented fully below).  No other significant GI issues at play currently.   GI Review of Systems Positive as above Negative for melena, hematochezia  Review of Systems General: Denies fevers/chills/weight loss unintentionally Cardiovascular: Denies chest pain Pulmonary: Denies shortness of breath Gastroenterological: See HPI Genitourinary: Denies darkened urine Hematological: Denies easy bruising/bleeding Dermatological: Denies jaundice Psychological: Mood is stable  Medications Current Outpatient Medications  Medication Sig Dispense Refill   atorvastatin  (LIPITOR) 10 MG tablet Take 10 mg by mouth daily.     busPIRone  (BUSPAR ) 5 MG tablet Take 5 mg by mouth 2 (two) times daily as needed.     Cholecalciferol (VITAMIN D3) 50 MCG (2000 UT) capsule Take 2,000 Units by mouth daily.     Cyanocobalamin (VITAMIN B 12) 500 MCG TABS Take 2 tablets by mouth daily.     escitalopram  (LEXAPRO ) 10 MG tablet Take 10 mg  by mouth daily.     fluticasone (FLONASE) 50 MCG/ACT nasal spray Place 1 spray into both nostrils in the morning and at bedtime.     folic acid  (FOLVITE ) 1 MG tablet Take 1 mg by mouth daily. (Patient taking differently: Take 800 mg by mouth daily.)      GLUCOSAMINE CHONDROITIN COMPLX PO Take by mouth.     Levomefolate Glucosamine (METHYL-FOLATE PO) Take 15 mg by mouth daily.     levothyroxine  (SYNTHROID ) 25 MCG tablet Take 25 mcg by mouth daily before breakfast.     losartan-hydrochlorothiazide (HYZAAR) 100-25 MG tablet Take 1 tablet by mouth daily.     MAGNESIUM PO Take 250 mg by mouth daily.     Multiple Vitamins-Minerals (WOMENS DAILY FORMULA PO) Take by mouth.     Potassium 99 MG TABS Take 99 mg by mouth daily.     ustekinumab (STELARA) 45 MG/0.5ML injection Inject 45 mg into the skin every 8 (eight) weeks.     vitamin E 180 MG (400 UNITS) capsule Take 400 Units by mouth daily.     Zinc 50 MG TABS Take 50 mg by mouth daily.     linaclotide  (LINZESS ) 145 MCG CAPS capsule Take 1 capsule (145 mcg total) by mouth daily. 30 capsule 2   valACYclovir (VALTREX) 1000 MG tablet Take 1,000 mg by mouth daily as needed (outbreak). (Patient not taking: Reported on 08/04/2023)     No current facility-administered medications for this visit.    Allergies No Known Allergies  Histories Past Medical History:  Diagnosis Date   Anxiety    Arthritis    Crohn's disease (HCC)    Depression    Hypertension    Hypothyroid    Sleep apnea    uses cpap   Past Surgical History:  Procedure Laterality Date   BALLOON DILATION Right 08/30/2021   Procedure: RIGHT URETERAL BALLOON DILATION;  Surgeon: Andrez Banker, MD;  Location: [facility];  Service: Urology;  Laterality: Right;  45 MINS FOR CASE   BOWEL RESECTION  2016   BREAST LUMPECTOMY Right 2008   BREAST REDUCTION SURGERY Bilateral 01/06/2020   Procedure: MAMMARY REDUCTION  (BREAST);  Surgeon: Alger Infield, MD;  Location: [facility];  Service: Plastics;  Laterality: Bilateral;   BREAST SURGERY     CARPAL TUNNEL RELEASE     CYSTOSCOPY WITH RETROGRADE PYELOGRAM, URETEROSCOPY AND STENT PLACEMENT Right 08/30/2021   Procedure: RIGHT RETROGRADE PYELOGRAM, RIGHT  DIAGNOSTIC URETEROSCOPY, LASER  URETEROTOMY, RIGHT STENT PLACEMENT;  Surgeon: Andrez Banker, MD;  Location: [facility];  Service: Urology;  Laterality: Right;   HERNIA REPAIR  07/19/2021   IR SINUS/FIST TUBE CHK-NON GI  02/15/2021   KNEE ARTHROSCOPY Right    OOPHORECTOMY Right 2003   [place]     ROBOTIC ASSITED PARTIAL NEPHRECTOMY Right 12/28/2020   Procedure: XI ROBOTIC ASSITED RIGHT PARTIAL NEPHRECTOMY;  Surgeon: Andrez Banker, MD;  Location: [facility];  Service: Urology;  Laterality: Right;   SCAR REVISION Bilateral 10/08/2020   Procedure: SCAR REVISION BILATERAL BREASTS;  Surgeon: Alger Infield, MD;  Location: [facility];  Service: Plastics;  Laterality: Bilateral;   THYROIDECTOMY, PARTIAL     URETEROSCOPY Right 01/31/2021   Procedure: CYSTOSCOPY/URETEROSCOPY/PYELOGRAM/RETROGRADE/STENT PLACEMENT;  Surgeon: Andrez Banker, MD;  Location: [facility];  Service: Urology;  Laterality: Right;   WISDOM TOOTH EXTRACTION     Social History   Socioeconomic History   Marital status: Media planner    Spouse  name: Not on file   Number of children: Not on file   Years of education: Not on file   Highest education level: Not on file  Occupational History   Not on file  Tobacco Use   Smoking status: Former    Types: Cigarettes   Smokeless tobacco: Never  Vaping Use   Vaping status: Never Used  Substance and Sexual Activity   Alcohol use: Yes    Comment: Socially    Drug use: Yes    Types: Marijuana    Comment: pt reports for pain and nausea d/t Chrons 4 x's/month   Sexual activity: Not on file  Other Topics Concern   Not on file  Social History Narrative   Not on file   Social Drivers of Health   Financial Resource Strain: Not on file  Food Insecurity: No Food Insecurity (03/05/2023)   Received from [facility]   QHS Food Insecurity    Worried About Programme researcher, broadcasting/film/video in the Last Year: Not on file    Ran Out of Food in  the Last Year: Not on file    Within the past 12 months, the food you bought just didn't last and you didn't have money to get more.: Not on file    In the past 12 months, were you worried that your food would run out before you got money to buy more or the food you bought just did not last and you did not have money to get more?: Unrecognized value  Transportation Needs: No Transportation Needs (03/05/2023)   Received from [facility]   QHS Transportation Needs    Lack of Transportation (Medical): Not on file    Lack of Transportation (Non-Medical): Not on file    Unmet Transportation Needs: Not on file    In the past 12 months, has lack of reliable transportation kept you from medical appointments, meetings, work or from getting to things needed for daily living?: Unrecognized value  Physical Activity: Not on file  Stress: Not on file  Social Connections: Unknown (08/05/2022)   Received from [facility]   Social Connections    How often do you feel lonely or isolated from those around you? (Adult - for ages 42 years and over): Not on file  Intimate Partner Violence: Not on file   Family History  Problem Relation Age of Onset   Hyperlipidemia Mother    Hypertension Mother    Hyperlipidemia Father    Hypertension Father    Atrial fibrillation Father    Colon cancer Neg Hx    Esophageal cancer Neg Hx    Inflammatory bowel disease Neg Hx    Liver disease Neg Hx    Pancreatic cancer Neg Hx    Rectal cancer Neg Hx    Stomach cancer Neg Hx    I have reviewed her medical, social, and family history in detail and updated the electronic medical record as necessary.    PHYSICAL EXAMINATION  BP 120/82   Pulse 87   Ht 5' 5 (1.651 m)   Wt 169 lb 6.4 oz (76.8 kg)   BMI 28.19 kg/m  Wt Readings from Last 3 Encounters:  08/04/23 169 lb 6.4 oz (76.8 kg)  08/30/21 156 lb 8 oz (71 kg)  02/06/21 149 lb 0.5 oz (67.6 kg)  GEN: NAD, appears stated age, doesn't appear chronically  ill, accompanied by wife PSYCH: Cooperative, without pressured speech EYE: Conjunctivae pink, sclerae anicteric ENT: MMM CV: Nontachycardic  RESP: No audible wheezing GI: NABS, soft, NT/ND, without rebound MSK/EXT: No significant lower extremity edema SKIN: No jaundice NEURO:  Alert & Oriented x 3, no focal deficits   REVIEW OF DATA  I reviewed the following data at the time of this encounter:  GI Procedures and Studies  2022 colonoscopy [facility] gastroenterology Hemorrhoids found on perianal exam. Patent side-to-side ileocolonic anastomosis characterized by healthy-appearing mucosa The examined portion of the ileum was normal. The examination was otherwise normal on direct and retroflexion views.  November 2023 clinic Patient doing well on Crohn's colitis so we will decrease Stelara to 45  May 2023 clinic Crohn's colitis with issues about getting her medications on time  June 2022 discussion of chronic disease as well as slow transit constipation  Laboratory Studies  Reviewed those in epic  Imaging Studies  December 2022 CTAP with contrast IMPRESSION: 1. Interval resolution of right psoas fluid collection with percutaneous drain in good position. 2. Well-positioned right double-J ureteral stent. 3. Areas of prior surgical change versus infarct in the medial lower pole and interpolar region of the right kidney. 4. Small bowel resection and ileocecectomy with widely patent anastomosis and no evidence of additional complication.   ASSESSMENT  Ms. Neria is a 58 y.o. female with a pmh significant for hypertension, hypothyroidism, OSA, arthritis, anxiety, MDD, Crohn's disease (status post ileocecectomy on Stelara).  The patient is seen today for evaluation and management of:  1. Crohn's disease of both small and large intestine without complication (HCC)   2. History of ileocecectomy   3. Immunosuppression due to drug therapy (HCC)   4. Bloating   5. Nausea   6.  Constipation, unspecified constipation type    The patient is clinically and hemodynamically stable at this time.  We will continue her on current Stelara dosing for which she has been on for a while at this time.  She expresses consideration of may be coming off in the future, we can discuss that in the future.  Based on her having Crohn's disease for nearly 10 years, there is no indication for her to begin colonoscopies every 2 years for which she will be due.  Will plan to do this later this year or early next year, based on patient's overall symptomatology.  We certainly would need to do this if we are thinking of coming off Stelara in the future.  Will obtain therapeutic drug monitoring at this time.  SIBO and EPI will need to be evaluated for her symptomatology though her constipation could be the reason for her bloating and distention.  All patient questions were answered to the best of my ability, and the patient agrees to the aforementioned plan of action with follow-up as indicated.   PLAN  Laboratories as outlined below Stool studies as outlined below Consider SIBO breath testing Stelara therapeutic drug monitoring labs to be obtained Continue Stelara 45 mg Can you in future discussions as to coming off Stelara versus monitoring current dosing Colonoscopy consideration this year or early next year - Certainly needs to be completed evaluation of mucosa if we are going to consider coming off medications in the future Continue Linzess  145 mcg daily Continue MiraLAX as needed   Orders Placed This Encounter  Procedures   CBC   Comp Met (CMET)   Sedimentation rate   C-reactive protein   IgA   Tissue transglutaminase, IgA   Pancreatic elastase, fecal   Fecal fat, qualitative   QuantiFERON-TB Gold Plus   Ustekinumab and Anti-Ustek  Ab    New Prescriptions   No medications on file   Modified Medications   Modified Medication Previous Medication   LINACLOTIDE  (LINZESS ) 145 MCG  CAPS CAPSULE linaclotide  (LINZESS ) 145 MCG CAPS capsule      Take 1 capsule (145 mcg total) by mouth daily.    Take 145 mcg by mouth daily.    Planned Follow Up Return in about 3 months (around 11/04/2023).   Total Time in Face-to-Face and in Coordination of Care for patient including independent/personal interpretation/review of prior testing, medical history, examination, medication adjustment, communicating results with the patient directly, and documentation within the EHR is 45 minutes.   Yong Henle, MD [facility] Office # [phone]

## 2023-08-07 NOTE — Telephone Encounter (Signed)
 Ok to fill Dr Brice Campi?

## 2023-08-07 NOTE — Telephone Encounter (Signed)
 She is now established with our group. Please begin filling for this patient. Her Stelara is at 45 mg. GM

## 2023-08-12 LAB — USTEKINUMAB AND ANTI-USTEK AB

## 2023-08-13 ENCOUNTER — Other Ambulatory Visit

## 2023-08-13 DIAGNOSIS — K5080 Crohn's disease of both small and large intestine without complications: Secondary | ICD-10-CM

## 2023-08-13 DIAGNOSIS — R110 Nausea: Secondary | ICD-10-CM

## 2023-08-13 DIAGNOSIS — R140 Abdominal distension (gaseous): Secondary | ICD-10-CM

## 2023-08-14 LAB — USTEKINUMAB AND ANTI-USTEK AB: Ustekinumab: 2.9 ug/mL

## 2023-08-14 LAB — SERIAL MONITORING

## 2023-08-14 NOTE — Telephone Encounter (Signed)
 Left message on machine to call back

## 2023-08-14 NOTE — Telephone Encounter (Signed)
 Patient requesting f/u call in regards to previous note. Please advise.   Thank you

## 2023-08-17 ENCOUNTER — Other Ambulatory Visit: Payer: Self-pay

## 2023-08-17 MED ORDER — USTEKINUMAB 45 MG/0.5ML ~~LOC~~ SOSY: 45.0000 mg | PREFILLED_SYRINGE | SUBCUTANEOUS | 0 refills | Status: AC

## 2023-08-17 NOTE — Telephone Encounter (Signed)
 The pharmacy information is   [facility]  [address] [place] [address]  Prescription has been sent- pt aware

## 2023-08-17 NOTE — Telephone Encounter (Signed)
 Spoke to the pt and she tells me that her Stelara  prescription needs to go to [place] with [place] [facility] [place] patient assistance.  [phone]

## 2023-08-17 NOTE — Telephone Encounter (Signed)
 Left message on machine to call back

## 2023-08-18 LAB — PANCREATIC ELASTASE, FECAL: Pancreatic Elastase-1, Stool: >800 ug/g (ref >200)

## 2023-08-20 LAB — FECAL FAT, QUALITATIVE
Fat Qual Neutral, Stl: NORMAL
Fat Qual Total, Stl: NORMAL

## 2023-08-24 ENCOUNTER — Telehealth: Payer: Self-pay | Admitting: Gastroenterology

## 2023-08-24 NOTE — Telephone Encounter (Signed)
 PT is calling to find out if there is a facility she can go to to have the breath test done. She finds the at home instructions to be very difficult. Please advise.

## 2023-08-24 NOTE — Telephone Encounter (Signed)
 The pt has been advised that there is no in office location to complete the breath test. She has been given the advise to call the number on the kit for an additional resource to complete the testing.

## 2023-09-14 ENCOUNTER — Telehealth: Payer: Self-pay | Admitting: Gastroenterology

## 2023-09-14 NOTE — Telephone Encounter (Signed)
 The patient is having a knee replacement at the end of September. Her next Stelara  injection is 10/03/23. Is this enough time and would she delay the injection of the following 8 weeks?

## 2023-09-14 NOTE — Telephone Encounter (Signed)
 Inbound call from patient stating she would like to speak to nurse in regards to medication stelara  has a few questions.  Requesting a call back   Please advise Thank you

## 2023-09-15 NOTE — Telephone Encounter (Signed)
 Rhonda Wilkerson, There is no absolute timing to consider stopping/holding biologics for patients who are undergoing surgical procedures. Some would would wait at least 4 weeks between Biologic and interventions to try to minimize issues of wound healing.   When is her date for surgery? If it is more than 4 weeks out, from when her next injection would be, it should be OK. Please update me with the surgery date however. Thanks. GM

## 2023-09-16 NOTE — Telephone Encounter (Signed)
 Patient advised of the recommendations. She will let us  know when she has scheduled her surgery.

## 2023-10-06 ENCOUNTER — Ambulatory Visit: Admitting: Family Medicine

## 2023-10-06 VITALS — BP 124/84 | Ht 65.0 in | Wt 164.0 lb

## 2023-10-06 DIAGNOSIS — M216X2 Other acquired deformities of left foot: Secondary | ICD-10-CM | POA: Diagnosis not present

## 2023-10-06 DIAGNOSIS — M7741 Metatarsalgia, right foot: Secondary | ICD-10-CM

## 2023-10-06 DIAGNOSIS — M79671 Pain in right foot: Secondary | ICD-10-CM | POA: Diagnosis not present

## 2023-10-06 DIAGNOSIS — M216X1 Other acquired deformities of right foot: Secondary | ICD-10-CM | POA: Diagnosis not present

## 2023-10-06 DIAGNOSIS — M79672 Pain in left foot: Secondary | ICD-10-CM

## 2023-10-06 DIAGNOSIS — M7742 Metatarsalgia, left foot: Secondary | ICD-10-CM

## 2023-10-06 NOTE — Progress Notes (Unsigned)
 DATE OF VISIT: 10/06/2023        Othel Leriche DOB: [birth year] MRN: [id number]  CC:  bilateral foot pain  History of present Illness: Kana Reimann is a 58 y.o. female who presents for evaluation of bilateral foot pain.  Patient states that the pain in her feet has been present for several months. She says the pain is predominantly located in the balls of her feet and is worse with exercise such as walking, using the treadmill, and biking. She has worn orthotics before in the past but got them back in 2018 while living in [place] . She also has a history of plantar fasciitis of her right foot that improved with steroid injections and has not bothered her since. She says the pain is slightly worse in her right foot.   Medications:  Outpatient Encounter Medications as of 10/06/2023  Medication Sig   atorvastatin  (LIPITOR) 10 MG tablet Take 10 mg by mouth daily.   busPIRone  (BUSPAR ) 5 MG tablet Take 5 mg by mouth 2 (two) times daily as needed.   Cholecalciferol (VITAMIN D3) 50 MCG (2000 UT) capsule Take 2,000 Units by mouth daily.   Cyanocobalamin (VITAMIN B 12) 500 MCG TABS Take 2 tablets by mouth daily.   escitalopram  (LEXAPRO ) 10 MG tablet Take 10 mg by mouth daily.   fluticasone (FLONASE) 50 MCG/ACT nasal spray Place 1 spray into both nostrils in the morning and at bedtime.   folic acid  (FOLVITE ) 1 MG tablet Take 1 mg by mouth daily. (Patient taking differently: Take 800 mg by mouth daily.)   GLUCOSAMINE CHONDROITIN COMPLX PO Take by mouth.   Levomefolate Glucosamine (METHYL-FOLATE PO) Take 15 mg by mouth daily.   levothyroxine  (SYNTHROID ) 25 MCG tablet Take 25 mcg by mouth daily before breakfast.   linaclotide  (LINZESS ) 145 MCG CAPS capsule Take 1 capsule (145 mcg total) by mouth daily.   losartan-hydrochlorothiazide (HYZAAR) 100-25 MG tablet Take 1 tablet by mouth daily.   MAGNESIUM PO Take 250 mg by mouth daily.   Multiple Vitamins-Minerals (WOMENS DAILY FORMULA PO) Take by mouth.    Potassium 99 MG TABS Take 99 mg by mouth daily.   ustekinumab  (STELARA ) 45 MG/0.5ML injection Inject 0.5 mLs (45 mg total) into the skin every 8 (eight) weeks for 6 doses.   valACYclovir (VALTREX) 1000 MG tablet Take 1,000 mg by mouth daily as needed (outbreak). (Patient not taking: Reported on 08/04/2023)   vitamin E 180 MG (400 UNITS) capsule Take 400 Units by mouth daily.   Zinc 50 MG TABS Take 50 mg by mouth daily.   No facility-administered encounter medications on file as of 10/06/2023.    Allergies: has no known allergies.  Physical Examination: Vitals: BP 124/84   Ht 5' 5 (1.651 m)   Wt 164 lb (74.4 kg)   BMI 27.29 kg/m  GENERAL:  Usha Slager is a 58 y.o. female appearing their stated age, alert and oriented x 3, in no apparent distress.  SKIN: no rashes or lesions, skin clean, dry, intact  MSK: Bilateral feet Inspection: No soft tissue or bony abnormalities appreciated. Moderate plantar arches. There is mild transverse arch collapse bilaterally with inward deviation of the second metatarsal and outward rolling of the 3rd, 4th, and 5th metatarsals bilaterally.  Palpation: There is slight tenderness to palpation over the transverse arch and the metatarsals. No tenderness to palpation at the base of the 5th metatarsal, cuboid, or calcaneus.  ROM: FROM with plantar flexion, dorsiflexion, eversion, and inversion.  Neuro/Vasc: sensation intact to  light touch, pulses 2+ and symmetric pedal pulses bilaterally, no edema  Assessment & Plan Bilateral foot pain Suspect patient's bilateral foot pain is due to degenerative changes in the transverse arch given her exam findings in clinic today. Discussed with patient that treatment with orthotic shoe inserts would be recommended to provide her with more transverse arch support. Patient is an agreement and would like to be fitted for orthotics today.   Patient was fitted for a : standard, cushioned, semi-rigid orthotic. The orthotic  was heated, placed on the orthotic stand. The patient was positioned in subtalar neutral position and 10 degrees of ankle dorsiflexion in a weight bearing stance on the heated orthotic blank The blank was ground to a stable position for weight bearing. Blank: Size 8 Fit and Run Base: None Posting: Bilateral small metatarsal pads  Face to face time spent was *** minutes. This time  included evaluation of foot pathology, associated joint issues and gait abnormalities.  Counseling patient regarding foot/ ankle /associated joint problems and gait issues, discussion of therapeutic options, measurement and manufacture of custom molded orthotic as detailed above.  Patient expressed understanding & agreement with above.  No diagnosis found.  No orders of the defined types were placed in this encounter.   Signe Ravel, MS4 [facility]

## 2023-10-07 ENCOUNTER — Encounter: Payer: Self-pay | Admitting: Family Medicine

## 2023-10-07 NOTE — Patient Instructions (Signed)
 Thank you for coming to see me today. It was a pleasure.   We made you new orthotics. Let us know if we need to add any extra cushioning or make changes.  If you have any questions or concerns, please do not hesitate to call the office at [phone].

## 2023-11-09 ENCOUNTER — Other Ambulatory Visit: Payer: Self-pay | Admitting: Gastroenterology

## 2023-11-24 ENCOUNTER — Other Ambulatory Visit: Payer: Self-pay | Admitting: Medical Genetics

## 2023-11-24 ENCOUNTER — Encounter: Payer: Self-pay | Admitting: Gastroenterology

## 2023-11-25 ENCOUNTER — Telehealth: Payer: Self-pay | Admitting: Gastroenterology

## 2023-11-25 NOTE — Telephone Encounter (Signed)
 Inbound call from patient stating she trying to get my knee surgery scheduled. [facility] needs Dr.Mansouraty to sign paperwork they had fax over so they can schedule surgery. Patient stated Harlin shot will be taken on 11/27/23 and she's trying to be scheduled for the surgery  the week of November 17.  Requesting a call back  Please advise  Thank you

## 2023-11-25 NOTE — Telephone Encounter (Signed)
 Responded to patient via my chart. We have not received form. Advised that form be re-faxed to our clinical fax number at [phone].

## 2023-11-27 NOTE — Telephone Encounter (Signed)
Patient calling in regards to previous note. Please advise.

## 2023-11-27 NOTE — Telephone Encounter (Signed)
 Letter of clearance faxed to [facility]. AttnBETHA Hoover. Patient has been informed. Letter has also been sent to patient via mychart.

## 2023-12-14 ENCOUNTER — Other Ambulatory Visit

## 2024-01-24 ENCOUNTER — Encounter: Payer: Self-pay | Admitting: Gastroenterology

## 2024-01-25 ENCOUNTER — Other Ambulatory Visit: Payer: Self-pay

## 2024-01-25 MED ORDER — LINACLOTIDE 145 MCG PO CAPS: 145.0000 ug | ORAL_CAPSULE | Freq: Every day | ORAL | 2 refills | Status: AC
